# Patient Record
Sex: Male | Born: 2000 | Race: White | Hispanic: No | Smoking: Former smoker
Health system: Southern US, Community
[De-identification: ages and names within clinical notes are randomized; demographics above are authoritative.]

## PROBLEM LIST (undated history)

## (undated) DIAGNOSIS — I471 Supraventricular tachycardia, unspecified: Secondary | ICD-10-CM

## (undated) DIAGNOSIS — F909 Attention-deficit hyperactivity disorder, unspecified type: Secondary | ICD-10-CM

## (undated) DIAGNOSIS — I499 Cardiac arrhythmia, unspecified: Secondary | ICD-10-CM

## (undated) DIAGNOSIS — R011 Cardiac murmur, unspecified: Secondary | ICD-10-CM

## (undated) DIAGNOSIS — F319 Bipolar disorder, unspecified: Secondary | ICD-10-CM

## (undated) HISTORY — DX: Cardiac murmur, unspecified: R01.1

## (undated) HISTORY — PX: APPENDECTOMY: SHX54

---
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
## 2019-08-05 ENCOUNTER — Emergency Department (HOSPITAL_COMMUNITY)
Admission: EM | Admit: 2019-08-05 | Discharge: 2019-08-05 | Disposition: A | Discharge status: Home / Self Care | Payer: Self-pay | Attending: Emergency Medicine | Admitting: Emergency Medicine

## 2019-08-05 ENCOUNTER — Other Ambulatory Visit: Payer: Self-pay

## 2019-08-05 ENCOUNTER — Emergency Department (HOSPITAL_COMMUNITY): Payer: Self-pay

## 2019-08-05 ENCOUNTER — Encounter (HOSPITAL_COMMUNITY): Payer: Self-pay | Admitting: Emergency Medicine

## 2019-08-05 DIAGNOSIS — R079 Chest pain, unspecified: Secondary | ICD-10-CM

## 2019-08-05 DIAGNOSIS — R072 Precordial pain: Secondary | ICD-10-CM | POA: Insufficient documentation

## 2019-08-05 DIAGNOSIS — R55 Syncope and collapse: Secondary | ICD-10-CM | POA: Insufficient documentation

## 2019-08-05 DIAGNOSIS — F17210 Nicotine dependence, cigarettes, uncomplicated: Secondary | ICD-10-CM | POA: Insufficient documentation

## 2019-08-05 LAB — CBC
HCT: 45 % (ref 39.0–52.0)
Hemoglobin: 15 g/dL (ref 13.0–17.0)
MCH: 29 pg (ref 26.0–34.0)
MCHC: 33.3 g/dL (ref 30.0–36.0)
MCV: 87 fL (ref 80.0–100.0)
Platelets: 295 10*3/uL (ref 150–400)
RBC: 5.17 MIL/uL (ref 4.22–5.81)
RDW: 12.9 % (ref 11.5–15.5)
WBC: 11.7 10*3/uL — ABNORMAL HIGH (ref 4.0–10.5)
nRBC: 0 % (ref 0.0–0.2)

## 2019-08-05 LAB — BASIC METABOLIC PANEL
Anion gap: 12 (ref 5–15)
BUN: 19 mg/dL (ref 6–20)
CO2: 21 mmol/L — ABNORMAL LOW (ref 22–32)
Calcium: 9.3 mg/dL (ref 8.9–10.3)
Chloride: 105 mmol/L (ref 98–111)
Creatinine, Ser: 0.84 mg/dL (ref 0.61–1.24)
GFR calc Af Amer: >60 mL/min (ref >60)
GFR calc non Af Amer: >60 mL/min (ref >60)
Glucose, Bld: 92 mg/dL (ref 70–99)
Potassium: 3.6 mmol/L (ref 3.5–5.1)
Sodium: 138 mmol/L (ref 135–145)

## 2019-08-05 LAB — TROPONIN I (HIGH SENSITIVITY)
Troponin I (High Sensitivity): <2 ng/L (ref <18)
Troponin I (High Sensitivity): 2 ng/L (ref <18)

## 2019-08-05 NOTE — ED Notes (Signed)
Pt ambulatory to waiting room. Pt verbalized understanding of discharge instructions.   

## 2019-08-05 NOTE — Discharge Instructions (Signed)
Follow-up with the cardiology clinic in the next few days.  Their contact information has been provided in this discharge summary for you to call and make these arrangements.  Return to the emergency department in the meantime if your symptoms worsen or change.  No strenuous activity until seen by cardiology.

## 2019-08-05 NOTE — ED Provider Notes (Signed)
[facility] EMERGENCY DEPARTMENT Provider Note   CSN: [id number] Arrival date & time: 08/05/19  1858     History Chief Complaint  Patient presents with  . Chest Pain    Gene Berry is a 19 y.o. male.  Patient is a 19 year old male with history of ADHD, bipolar disorder, appendectomy.  He presents today for evaluation of chest pain and syncope.  Patient works Games developer and a Presenter, broadcasting.  He works in a [employer] with significant heat.  Today as he was working he developed an episode of tightness in his chest, left arm numbness, followed by a brief period of unconsciousness.  He was apparently awakened by his coworkers, then taken to urgent care.  He was then sent here for further evaluation.  His discomfort has since resolved and he now feels fine.  Patient does report similar episodes over the past several months, however has never passed out before.  Patient has no cardiac risk factors except obesity.  The history is provided by the patient.  Chest Pain Pain location:  Substernal area Pain quality: tightness   Pain radiates to:  L arm Pain severity:  Moderate Onset quality:  Sudden Timing:  Constant Progression:  Resolved Relieved by:  Nothing Worsened by:  Nothing Ineffective treatments:  None tried Associated symptoms: syncope   Associated symptoms: no nausea and no shortness of breath        Past Medical History:  Diagnosis Date  . ADHD   . Bipolar affective disorder (HCC)     There are no problems to display for this patient.   Past Surgical History:  Procedure Laterality Date  . APPENDECTOMY         History reviewed. No pertinent family history.  Social History   Tobacco Use  . Smoking status: Current Every Day Smoker    Packs/day: 2.00    Types: Cigarettes  . Smokeless tobacco: Current User    Types: Chew  Vaping Use  . Vaping Use: Some days  Substance Use Topics  . Alcohol use: Not Currently  . Drug use: Not Currently      Home Medications Prior to Admission medications   Not on File    Allergies    Augmentin [amoxicillin-pot clavulanate]  Review of Systems   Review of Systems  Respiratory: Negative for shortness of breath.   Cardiovascular: Positive for chest pain and syncope.  Gastrointestinal: Negative for nausea.  All other systems reviewed and are negative.   Physical Exam Updated Vital Signs BP 136/85 (BP Location: Right Arm)   Pulse 95   Temp 98.3 F (36.8 C) (Oral)   Resp 20   Ht 5\' 9"  (1.753 m)   Wt (!) 140.2 kg   SpO2 98%   BMI 45.63 kg/m   Physical Exam Vitals and nursing note reviewed.  Constitutional:      General: He is not in acute distress.    Appearance: He is well-developed. He is not diaphoretic.  HENT:     Head: Normocephalic and atraumatic.  Cardiovascular:     Rate and Rhythm: Normal rate and regular rhythm.     Heart sounds: No murmur heard.  No friction rub.  Pulmonary:     Effort: Pulmonary effort is normal. No respiratory distress.     Breath sounds: Normal breath sounds. No wheezing or rales.  Abdominal:     General: Bowel sounds are normal. There is no distension.     Palpations: Abdomen is soft.  Tenderness: There is no abdominal tenderness.  Musculoskeletal:        General: Normal range of motion.     Cervical back: Normal range of motion and neck supple.     Right lower leg: No tenderness. No edema.     Left lower leg: No tenderness. No edema.  Skin:    General: Skin is warm and dry.  Neurological:     Mental Status: He is alert and oriented to person, place, and time.     Coordination: Coordination normal.     ED Results / Procedures / Treatments   Labs (all labs ordered are listed, but only abnormal results are displayed) Labs Reviewed  BASIC METABOLIC PANEL - Abnormal; Notable for the following components:      Result Value   CO2 21 (*)    All other components within normal limits  CBC - Abnormal; Notable for the following  components:   WBC 11.7 (*)    All other components within normal limits  TROPONIN I (HIGH SENSITIVITY)  TROPONIN I (HIGH SENSITIVITY)    EKG ED ECG REPORT   Date: 08/05/2019  Rate: 100  Rhythm: sinus tachycardia  QRS Axis: normal  Intervals: normal  ST/T Wave abnormalities: normal  Conduction Disutrbances:none  Narrative Interpretation:   Old EKG Reviewed: none available  I have personally reviewed the EKG tracing and agree with the computerized printout as noted.   Radiology DG Chest 2 View  Result Date: 08/05/2019 CLINICAL DATA:  19 year old male with chest pain. EXAM: CHEST - 2 VIEW COMPARISON:  None. FINDINGS: The heart size and mediastinal contours are within normal limits. Both lungs are clear. The visualized skeletal structures are unremarkable. IMPRESSION: No active cardiopulmonary disease. Electronically Signed   By: Elgie Collard M.D.   On: 08/05/2019 19:30    Procedures Procedures (including critical care time)  Medications Ordered in ED Medications - No data to display  ED Course  I have reviewed the triage vital signs and the nursing notes.  Pertinent labs & imaging results that were available during my care of the patient were reviewed by me and considered in my medical decision making (see chart for details).    MDM Rules/Calculators/A&P  Patient presenting here with complaints of chest pain and syncope as described in the HPI.  Patient's EKG shows a sinus tachycardia with no obvious abnormality.  Troponin x2 is negative.  I am uncertain as to the etiology of this patient's symptoms, however I feel as though an echocardiogram and cardiology follow-up would be appropriate.  Patient will be given the number for the cardiology clinic to call and make this appointment.  He is to return in the meantime if his symptoms worsen or change.  Final Clinical Impression(s) / ED Diagnoses Final diagnoses:  None    Rx / DC Orders ED Discharge Orders    None         Geoffery Lyons, MD 08/05/19 2213

## 2019-08-05 NOTE — ED Notes (Signed)
Not available for triage

## 2019-08-05 NOTE — ED Triage Notes (Signed)
Patient is having chest pain that started at work around 1230 pm, had a syncopal episode, was sent to urgent care, arrive here around 130 pm, but fell asleep in the waiting room.

## 2019-09-19 ENCOUNTER — Ambulatory Visit: Payer: Self-pay | Admitting: Internal Medicine

## 2019-10-03 ENCOUNTER — Other Ambulatory Visit: Payer: Self-pay

## 2019-10-03 ENCOUNTER — Ambulatory Visit (INDEPENDENT_AMBULATORY_CARE_PROVIDER_SITE_OTHER): Payer: Self-pay | Admitting: Cardiology

## 2019-10-03 ENCOUNTER — Encounter: Payer: Self-pay | Admitting: *Deleted

## 2019-10-03 ENCOUNTER — Encounter: Payer: Self-pay | Admitting: Cardiology

## 2019-10-03 VITALS — BP 120/78 | HR 102 | Ht 70.0 in | Wt 305.0 lb

## 2019-10-03 DIAGNOSIS — R002 Palpitations: Secondary | ICD-10-CM

## 2019-10-03 DIAGNOSIS — R079 Chest pain, unspecified: Secondary | ICD-10-CM

## 2019-10-03 NOTE — Progress Notes (Signed)
Clinical Summary Gene Berry is a 19 y.o.male seen today as a new patient for the following medical problems.   1. 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 Left sided pain, sharp stabbing 10/10. Vomited, felt weak. Stood up and troubles standing due weakness. Sat back down - recurrent pain down left arm, left arm went numb and passed out - was told at urgent care HRs were elevated.  - constant pain x 4 hours, worst with movement.  - taken to ER  - similar pain few weeks prior.  - intermittent left arm numbness since Wed. Can get heacache.  - can have sudden episodes of feeling like he may pass out. +palpitations - limited caffeine      Past Medical History:  Diagnosis Date  . ADHD   . Bipolar affective disorder (HCC)      Allergies  Allergen Reactions  . Augmentin [Amoxicillin-Pot Clavulanate] Anaphylaxis     No current outpatient medications on file.   No current facility-administered medications for this visit.     Past Surgical History:  Procedure Laterality Date  . APPENDECTOMY       Allergies  Allergen Reactions  . Augmentin [Amoxicillin-Pot Clavulanate] Anaphylaxis      No family history on file.   Social History Gene Berry reports that he has been smoking cigarettes. He has been smoking about 2.00 packs per day. His smokeless tobacco use includes chew. Gene Berry reports previous alcohol use.   Review of Systems CONSTITUTIONAL: No weight loss, fever, chills, weakness or fatigue.  HEENT: Eyes: No visual loss, blurred vision, double vision or yellow sclerae.No hearing loss, sneezing, congestion, runny nose or sore throat.  SKIN: No rash or itching.  CARDIOVASCULAR: per hpi RESPIRATORY: No shortness of breath, cough or sputum.    GASTROINTESTINAL: No anorexia, nausea, vomiting or diarrhea. No abdominal pain or blood.  GENITOURINARY: No burning on urination, no polyuria NEUROLOGICAL: No headache, dizziness, syncope, paralysis, ataxia, numbness or tingling in the extremities. No change in bowel or bladder control.  MUSCULOSKELETAL: No muscle, back pain, joint pain or stiffness.  LYMPHATICS: No enlarged nodes. No history of splenectomy.  PSYCHIATRIC: No history of depression or anxiety.  ENDOCRINOLOGIC: No reports of sweating, cold or heat intolerance. No polyuria or polydipsia.  Marland Kitchen   Physical Examination Today's Vitals   10/03/19 0850 10/03/19 0856  BP: 130/86 120/78  Pulse: (!) 102   SpO2: 98%   Weight: (!) 305 lb (138.3 kg)   Height: 5\' 10"  (1.778 m)    Body mass index is 43.76 kg/m.  Gen: resting comfortably, no acute distress HEENT: no scleral icterus, pupils equal round and reactive, no palptable cervical adenopathy,  CV: RRR, no m/r/g no jvd Resp: Clear to auscultation bilaterally GI: abdomen is soft, non-tender, non-distended, normal bowel sounds, no hepatosplenomegaly MSK: extremities are warm, no edema.  Skin: warm, no rash Neuro:  no focal deficits Psych: appropriate affect     Assessment and Plan  1. Chest pain/Palpitations/Syncope/Left arm weakness - unclear etiology of this constellation of symptoms in this very young patient - will obtain echo to evaluate for any underlying cardiac dysfunction - obtain heart monitor to evaluate for significant arrhythmias - if benign cardiac workup may need  neuro eval with arm weakness, syncope - no plans for ischemic testing at this time in this 19 year old patient      Antoine Poche, M.D.

## 2019-10-03 NOTE — Patient Instructions (Signed)
Your physician recommends that you schedule a follow-up appointment in: 6 WEEKS WITH Nena Polio, NP  Your physician recommends that you continue on your current medications as directed. Please refer to the Current Medication list given to you today.  Your physician has requested that you have an echocardiogram. Echocardiography is a painless test that uses sound waves to create images of your heart. It provides your doctor with information about the size and shape of your heart and how well your heart's chambers and valves are working. This procedure takes approximately one hour. There are no restrictions for this procedure.  ZIO PATCH 14 DAYS   Thank you for choosing [facility]!!

## 2019-10-08 ENCOUNTER — Telehealth: Payer: Self-pay | Admitting: Cardiology

## 2019-10-08 ENCOUNTER — Other Ambulatory Visit: Payer: Self-pay

## 2019-10-08 ENCOUNTER — Ambulatory Visit (HOSPITAL_COMMUNITY)
Admission: RE | Admit: 2019-10-08 | Discharge: 2019-10-08 | Disposition: A | Discharge status: Home / Self Care | Payer: Self-pay | Source: Ambulatory Visit | Attending: Cardiology | Admitting: Cardiology

## 2019-10-08 DIAGNOSIS — R079 Chest pain, unspecified: Secondary | ICD-10-CM

## 2019-10-08 LAB — ECHOCARDIOGRAM COMPLETE
Area-P 1/2: 2.81 cm2
S' Lateral: 3.42 cm

## 2019-10-08 NOTE — Telephone Encounter (Signed)
Patient called stating that he has not received his heart monitor. Requested to know test results. States he is suppose to return to work on 10-11-2019. If unable to return to work will need a note to send to [facility] before Friday afternoon.

## 2019-10-08 NOTE — Progress Notes (Signed)
*  PRELIMINARY RESULTS* Echocardiogram 2D Echocardiogram has been performed.  Stacey Drain 10/08/2019, 2:29 PM

## 2019-10-09 ENCOUNTER — Encounter: Payer: Self-pay | Admitting: *Deleted

## 2019-10-09 NOTE — Telephone Encounter (Signed)
Just resulted, sent it to [facility] here by mistake, will forward to you  Dominga Ferry MD

## 2019-10-09 NOTE — Telephone Encounter (Signed)
As per recent conversation with admin (at last appt) pt was supposed to bring insurance info before we enrolled him  - clarified that pt doesn't have insurance - spoke with [employer] who says out of pocket would be $395 also pt has another option of qualifying for assistance based on household income (pt has to call company and apply over the phone) spoke with pt and agreeable to monitor and will call about assistance - requesting Dr Wyline Mood review his echo and possible extend his work note for HR staying in the 120s while resting yesterday

## 2019-10-09 NOTE — Telephone Encounter (Signed)
Pt aware and will come by office to pick up work note     Echo looks good, normal heart function. Ok to extend his work absence 2 weeks as we sort out him getting the heart monitor   J BrancH MD

## 2019-10-09 NOTE — Telephone Encounter (Signed)
Patient called checking to see if test results have been reviewed. Also, needs to know if he will be able to return to work.

## 2019-10-28 ENCOUNTER — Telehealth: Payer: Self-pay | Admitting: Cardiology

## 2019-10-28 ENCOUNTER — Other Ambulatory Visit: Payer: Self-pay | Admitting: *Deleted

## 2019-10-28 ENCOUNTER — Ambulatory Visit (INDEPENDENT_AMBULATORY_CARE_PROVIDER_SITE_OTHER): Payer: Self-pay

## 2019-10-28 DIAGNOSIS — R002 Palpitations: Secondary | ICD-10-CM

## 2019-10-28 NOTE — Telephone Encounter (Signed)
zio monitor scanned into pt chart

## 2019-10-28 NOTE — Telephone Encounter (Signed)
Patient called requesting Monitor results

## 2019-10-29 NOTE — Telephone Encounter (Signed)
Heart monitor looks fine, no significant arrhythmias. Significant heart disease in a 19 year old is rare and based on his echo findings and monitor all testing has benign. Needs to f/u with pcp to consider alternative causes of his symptoms   Dominga Ferry MD

## 2019-10-29 NOTE — Telephone Encounter (Signed)
Pt voiced understanding

## 2019-11-12 NOTE — Progress Notes (Signed)
Cardiology Office Note  Date: 11/13/2019   ID: Gene Berry, DOB 2000/12/11, MRN [id number]  PCP:  Patient, No Pcp Per  Cardiologist:  No primary care provider on file. Electrophysiologist:  None   Chief Complaint: Chest pain of uncertain etiology  History of Present Illness: Gene Berry is a 19 y.o. male with a history of chest pain/syncope, ADHD, bipolar disease.  Last encounter with Dr. Wyline Mood 10/03/2019 status post ER visit with chest pain and syncope on 07/2019.  Troponins were negative x3, EKG showed no ischemic changes.  Chest x-ray : no acute process.  Had been at work unloading boxes in hot temperatures.  Had left-sided chest pain sharp stabbing in nature.  Vomited, felt weak. Trouble standing. Pain in left arm. Went numb and passed out. Could have sudden episodes of feeling like he may pass out. Palpitations. 14 day Zio patch was ordered. Echo ordered. No plans for ischemic work up.  Echocardiogram: EF 55 to 60%,  Trivial MR.  Cardiac monitor: Rare supraventricular ectopy in the form of isolated PACs, couplets.  Rare ventricular ectopy in form of isolated PVCs.  Symptoms correlated with sinus rhythm and mild sinus tachycardia.  Here for follow-up today.  He continues to complain of dizziness and rapid heart rate.  States dizziness associated with changes in position such as going from lying to sitting to standing, or bending then attempting to stand erect with dizziness with associated increases in heart rate.  He denies any significant caffeine intake, stimulant intake, recreational drugs.  States he has chest pain about 15 to 20 minutes after he eats.  He points to left precordial area and states it radiates around to his left axillary area.  He denies any associated radiation to neck, arm, back, jaw.  Denies any nausea, vomiting, or diaphoresis associated.  Heart rate today on arrival is 116.  He states he has a strong family history of diabetes in his father.  He admits to  frequent urination and drinking frequent liquids.  Past Medical History:  Diagnosis Date  . ADHD   . Bipolar affective disorder [facility])     Past Surgical History:  Procedure Laterality Date  . APPENDECTOMY      Current Outpatient Medications  Medication Sig Dispense Refill  . metoprolol tartrate (LOPRESSOR) 25 MG tablet Take 0.5 tablets (12.5 mg total) by mouth 2 (two) times daily. 15 tablet 6   No current facility-administered medications for this visit.   Allergies:  Augmentin [amoxicillin-pot clavulanate]   Social History: The patient  reports that he has been smoking cigarettes. He has been smoking about 2.00 packs per day. His smokeless tobacco use includes chew. He reports previous alcohol use. He reports previous drug use.   Family History: The patient's family history includes Anxiety disorder in his mother; Cancer - Other in his father; Cirrhosis in his maternal grandfather; Heart attack in his maternal grandfather, maternal grandmother, paternal grandfather, and paternal grandmother; Hypertension in his father; Stroke in his maternal grandfather.   ROS:  Please see the history of present illness. Otherwise, complete review of systems is positive for none.  All other systems are reviewed and negative.   Physical Exam: VS:  BP 112/80   Pulse (!) 116   Ht 5\' 10"  (1.778 m)   Wt (!) 309 lb 6.4 oz (140.3 kg)   SpO2 99%   BMI 44.39 kg/m , BMI Body mass index is 44.39 kg/m.  Wt Readings from Last 3 Encounters:  11/13/19 (!) 309 lb  6.4 oz (140.3 kg) (>99 %, Z= 3.17)*  10/03/19 (!) 305 lb (138.3 kg) (>99 %, Z= 3.12)*  08/05/19 (!) 309 lb (140.2 kg) (>99 %, Z= 3.17)*   * Growth percentiles are based on CDC (Boys, 2-20 Years) data.    General: Patient appears comfortable at rest. HEENT: Conjunctiva and lids normal, oropharynx clear with moist mucosa. Neck: Supple, no elevated JVP or carotid bruits, no thyromegaly. Lungs: Clear to auscultation, nonlabored breathing at  rest. Cardiac: Tachycardic rate and rhythm, no S3 or significant systolic murmur, no pericardial rub. Abdomen: Soft, nontender, no hepatomegaly, bowel sounds present, no guarding or rebound. Extremities: No pitting edema, distal pulses 2+. Skin: Warm and dry. Musculoskeletal: No kyphosis. Neuropsychiatric: Alert and oriented x3, affect grossly appropriate.  ECG:    Recent Labwork: 08/05/2019: BUN 19; Creatinine, Ser 0.84; Hemoglobin 15.0; Platelets 295; Potassium 3.6; Sodium 138  No results found for: CHOL, TRIG, HDL, CHOLHDL, VLDL, LDLCALC, LDLDIRECT  Other Studies Reviewed Today:  Cardiac monitor 10/28/2019 Study Highlights   9 days 14 hours of monitored data  Min HR 43, Max HR 177, Avg HR 91  Rare supraventricular ectopy in the form of isolated PACs, couplets  Rare ventricular ectopy in the form of isolated PVCs  Symptoms correlate with sinus rhythm and mild sinus tachycardia   Echocardiogram 10/08/2019 1. Left ventricular ejection fraction, by estimation, is 55 to 60%. The left ventricle has normal function. Left ventricular endocardial border not optimally defined to evaluate regional wall motion. Left ventricular diastolic parameters were normal. 2. Right ventricular systolic function is normal. The right ventricular size is normal. Tricuspid regurgitation signal is inadequate for assessing PA pressure. 3. The mitral valve is grossly normal. Trivial mitral valve regurgitation. 4. The aortic valve is tricuspid. Aortic valve regurgitation is not visualized. 5. The inferior vena cava is normal in size with greater than 50% respiratory variability, suggesting right atrial pressure of 3 mmHg.  Assessment and Plan:  1. Chest pain of uncertain etiology   2. Syncope, unspecified syncope type   3. Palpitations    1. Chest pain of uncertain etiology Continues to complain of chest pain mostly after eating approximately 15 to 20 minutes after eating he experiences left  precordial pain which radiates along the bottom of the rib cage over the axillary area.  Denies any radiation to neck, arm, back, jaw.  Denies any associated nausea, vomiting, or diaphoresis.  Denies any history of GERD or reflux symptoms.  2. Syncope, unspecified syncope type Continues to state he feels dizzy quite a bit usually going from a lying to sitting or sitting to standing position.  Sometimes when bending over and attempting to stand erect.  States recent episodes in the shower feeling as though he may pass out.  We will get orthostatic vital signs today.  Question orthostatic hypotension, autonomic dysfunction, diabetes mellitus, POTS syndrome. Get CBC, BMP, TSH, hemoglobin A1c.  Has strong family history of diabetes in first-degree relative in his father.  3. Palpitations Patient appears to have consistent tachycardia.  Today on arrival heart rate is 116.  He states his heart rate states elevated generally speaking but becomes worse when attempting to change positions from lying to sitting to standing. Start metoprolol 12.5 mg p.o. twice daily.    Medication Adjustments/Labs and Tests Ordered: Current medicines are reviewed at length with the patient today.  Concerns regarding medicines are outlined above.   Disposition: Follow-up with Dr. Wyline Mood or APP 1 month  Signed, Rennis Harding, NP 11/13/2019 2:41  PM    [facility] at [place], [place], [place] [address] Phone: [phone]; Fax: [phone]

## 2019-11-13 ENCOUNTER — Ambulatory Visit (INDEPENDENT_AMBULATORY_CARE_PROVIDER_SITE_OTHER): Payer: Self-pay | Admitting: Family Medicine

## 2019-11-13 ENCOUNTER — Encounter: Payer: Self-pay | Admitting: Family Medicine

## 2019-11-13 ENCOUNTER — Other Ambulatory Visit: Payer: Self-pay

## 2019-11-13 VITALS — BP 112/80 | HR 116 | Ht 70.0 in | Wt 309.4 lb

## 2019-11-13 DIAGNOSIS — R079 Chest pain, unspecified: Secondary | ICD-10-CM

## 2019-11-13 DIAGNOSIS — R002 Palpitations: Secondary | ICD-10-CM

## 2019-11-13 DIAGNOSIS — R55 Syncope and collapse: Secondary | ICD-10-CM

## 2019-11-13 MED ORDER — METOPROLOL TARTRATE 25 MG PO TABS: 12.5000 mg | ORAL_TABLET | Freq: Two times a day (BID) | ORAL | 6 refills | Status: DC

## 2019-11-13 NOTE — Patient Instructions (Addendum)
Medication Instructions:   Begin Lopressor 12.5mg  twice a day  Continue all other current medications.  Labwork:  CBC, BMET, TSH, HgA1C - orders given today.   Reminder:  Nothing to eat or drink after 12 midnight prior to labs.  Office will contact with results via phone or letter.    Testing/Procedures: none  Follow-Up: 1 month   Any Other Special Instructions Will Be Listed Below (If Applicable).  If you need a refill on your cardiac medications before your next appointment, please call your pharmacy.

## 2019-11-14 ENCOUNTER — Other Ambulatory Visit (HOSPITAL_COMMUNITY)
Admission: RE | Admit: 2019-11-14 | Discharge: 2019-11-14 | Disposition: A | Discharge status: Home / Self Care | Payer: Self-pay | Source: Ambulatory Visit | Attending: Family Medicine | Admitting: Family Medicine

## 2019-11-14 DIAGNOSIS — R079 Chest pain, unspecified: Secondary | ICD-10-CM | POA: Insufficient documentation

## 2019-11-14 DIAGNOSIS — R55 Syncope and collapse: Secondary | ICD-10-CM | POA: Insufficient documentation

## 2019-11-14 DIAGNOSIS — R002 Palpitations: Secondary | ICD-10-CM | POA: Insufficient documentation

## 2019-11-14 LAB — BASIC METABOLIC PANEL
Anion gap: 8 (ref 5–15)
BUN: 19 mg/dL (ref 6–20)
CO2: 24 mmol/L (ref 22–32)
Calcium: 9.6 mg/dL (ref 8.9–10.3)
Chloride: 105 mmol/L (ref 98–111)
Creatinine, Ser: 0.82 mg/dL (ref 0.61–1.24)
GFR, Estimated: >60 mL/min (ref >60)
Glucose, Bld: 103 mg/dL — ABNORMAL HIGH (ref 70–99)
Potassium: 3.9 mmol/L (ref 3.5–5.1)
Sodium: 137 mmol/L (ref 135–145)

## 2019-11-14 LAB — CBC
HCT: 48.4 % (ref 39.0–52.0)
Hemoglobin: 16.1 g/dL (ref 13.0–17.0)
MCH: 29 pg (ref 26.0–34.0)
MCHC: 33.3 g/dL (ref 30.0–36.0)
MCV: 87.1 fL (ref 80.0–100.0)
Platelets: 319 10*3/uL (ref 150–400)
RBC: 5.56 MIL/uL (ref 4.22–5.81)
RDW: 12.6 % (ref 11.5–15.5)
WBC: 8.5 10*3/uL (ref 4.0–10.5)
nRBC: 0 % (ref 0.0–0.2)

## 2019-11-14 LAB — TSH: TSH: 1.381 u[IU]/mL (ref 0.350–4.500)

## 2019-11-17 ENCOUNTER — Telehealth: Payer: Self-pay | Admitting: *Deleted

## 2019-11-17 LAB — HEMOGLOBIN A1C
Hgb A1c MFr Bld: 5.5 % (ref 4.8–5.6)
Mean Plasma Glucose: 111 mg/dL

## 2019-11-17 NOTE — Telephone Encounter (Signed)
Lesle Chris, LPN  04/06/9456 11:46 AM EDT Back to Top    Voice mailbox not set up.    Netta Neat., NP  11/17/2019 11:42 AM EDT     Hemoglobin A1c looks good at 5.5%.    Netta Neat., NP  11/14/2019 4:49 PM EDT     Please call the patient and tell him all of his lab work looks good except his fasting glucose was slightly above normal. We still do not have his hemoglobin A1c yet. Tell him we will call him if this is abnormal when we get the results.

## 2019-11-20 ENCOUNTER — Encounter: Payer: Self-pay | Admitting: *Deleted

## 2019-11-20 NOTE — Telephone Encounter (Signed)
Lesle Chris, LPN  16/38/4536 1:35 PM EDT Back to Top    2nd attempt - voice mail box not set up. Will send results via my chart.

## 2019-12-10 NOTE — Progress Notes (Signed)
Cardiology Office Note  Date: 12/11/2019   ID: Gene Berry, DOB [birth year], MRN [id number]  PCP:  Patient, No Pcp Per  Cardiologist:  Dina Rich, MD Electrophysiologist:  None   Chief Complaint: Chest pain of uncertain etiology  History of Present Illness: Gene Berry is a 19 y.o. male with a history of chest pain/syncope, ADHD, bipolar disease.  Last encounter with Dr. Wyline Mood 10/03/2019 status post ER visit with chest pain and syncope on 07/2019.  Troponins were negative x3, EKG showed no ischemic changes.  Chest x-ray : no acute process.  Had been at work unloading boxes in hot temperatures.  Had left-sided chest pain sharp stabbing in nature.  Vomited, felt weak. Trouble standing. Pain in left arm. Went numb and passed out. Could have sudden episodes of feeling like he may pass out. Palpitations. 14 day Zio patch was ordered. Echo ordered. No plans for ischemic work up.  Echocardiogram: EF 55 to 60%,  Trivial MR.  Cardiac monitor: Rare supraventricular ectopy in the form of isolated PACs, couplets.  Rare ventricular ectopy in form of isolated PVCs.  Symptoms correlated with sinus rhythm and mild sinus tachycardia.  Last visit on 11/13/2019.  He continued to complain of dizziness and rapid heart rate.  Stated dizziness associated with changes in position such as going from lying to sitting to standing, or bending then attempting to stand erect with dizziness with associated increases in heart rate.  He denied any significant caffeine intake, stimulant intake, recreational drugs.  Stated he had chest pain about 15 to 20 minutes after he eats.  Pointed to left precordial area and s stated it radiated around to his left axillary area.  He denied any associated radiation to neck, arm, back, jaw.  No nausea, vomiting, or diaphoresis associated.  Heart rate on arrival was 116.  He stated he had a strong family history of diabetes in his father.  He admitted to frequent urination and  drinking frequent liquids.   He presents today for 1 month follow-up.  He states the medication seems to be helping him with his heart rate and dizziness.   Past Medical History:  Diagnosis Date  . ADHD   . Bipolar affective disorder [facility])     Past Surgical History:  Procedure Laterality Date  . APPENDECTOMY      Current Outpatient Medications  Medication Sig Dispense Refill  . metoprolol tartrate (LOPRESSOR) 25 MG tablet Take 0.5 tablets (12.5 mg total) by mouth 2 (two) times daily. 15 tablet 6   No current facility-administered medications for this visit.   Allergies:  Augmentin [amoxicillin-pot clavulanate]   Social History: The patient  reports that he has been smoking cigarettes. He has been smoking about 2.00 packs per day. His smokeless tobacco use includes chew. He reports previous alcohol use. He reports previous drug use.   Family History: The patient's family history includes Anxiety disorder in his mother; Cancer - Other in his father; Cirrhosis in his maternal grandfather; Heart attack in his maternal grandfather, maternal grandmother, paternal grandfather, and paternal grandmother; Hypertension in his father; Stroke in his maternal grandfather.   ROS:  Please see the history of present illness. Otherwise, complete review of systems is positive for none.  All other systems are reviewed and negative.   Physical Exam: VS:  BP 132/64   Pulse 96   Ht 5\' 9"  (1.753 m)   Wt (!) 329 lb (149.2 kg)   SpO2 96%   BMI 48.58 kg/m , BMI  Body mass index is 48.58 kg/m.  Wt Readings from Last 3 Encounters:  12/11/19 (!) 329 lb (149.2 kg) (>99 %, Z= 3.36)*  11/13/19 (!) 309 lb 6.4 oz (140.3 kg) (>99 %, Z= 3.17)*  10/03/19 (!) 305 lb (138.3 kg) (>99 %, Z= 3.12)*   * Growth percentiles are based on CDC (Boys, 2-20 Years) data.    General: Patient appears comfortable at rest. Neck: Supple, no elevated JVP or carotid bruits, no thyromegaly. Lungs: Clear to auscultation,  nonlabored breathing at rest. Cardiac: Tachycardic rate and rhythm, no S3 or significant systolic murmur, no pericardial rub. Extremities: No pitting edema, distal pulses 2+. Skin: Warm and dry. Musculoskeletal: No kyphosis. Neuropsychiatric: Alert and oriented x3, affect grossly appropriate.  ECG:    Recent Labwork: 11/14/2019: BUN 19; Creatinine, Ser 0.82; Hemoglobin 16.1; Platelets 319; Potassium 3.9; Sodium 137; TSH 1.381  No results found for: CHOL, TRIG, HDL, CHOLHDL, VLDL, LDLCALC, LDLDIRECT  Other Studies Reviewed Today:  Cardiac monitor 10/28/2019 Study Highlights   9 days 14 hours of monitored data  Min HR 43, Max HR 177, Avg HR 91  Rare supraventricular ectopy in the form of isolated PACs, couplets  Rare ventricular ectopy in the form of isolated PVCs  Symptoms correlate with sinus rhythm and mild sinus tachycardia   Echocardiogram 10/08/2019 1. Left ventricular ejection fraction, by estimation, is 55 to 60%. The left ventricle has normal function. Left ventricular endocardial border not optimally defined to evaluate regional wall motion. Left ventricular diastolic parameters were normal. 2. Right ventricular systolic function is normal. The right ventricular size is normal. Tricuspid regurgitation signal is inadequate for assessing PA pressure. 3. The mitral valve is grossly normal. Trivial mitral valve regurgitation. 4. The aortic valve is tricuspid. Aortic valve regurgitation is not visualized. 5. The inferior vena cava is normal in size with greater than 50% respiratory variability, suggesting right atrial pressure of 3 mmHg.  Assessment and Plan:   1. Chest pain of uncertain etiology Currently denies any anginal symptoms.  2. Syncope, unspecified syncope type No recent symptoms of lightheadedness dizziness, presyncope or syncope.  3. Palpitations Recent cardiac monitor showed rare supraventricular ectopy and form of isolated PACs/couplets rare  ventricular ectopy with isolated PVCs.  Symptoms correlated with sinus rhythm and mild sinus tachycardia.  Continue metoprolol 12.5 mg p.o. twice daily.  Heart rate has decreased since last visit from 116 to rate of 96 today.  He denies any sensation of palpitations, arrhythmias, racing heart.  4.  Suspected sleep apnea  Patient states his fiance tells him he snores and occasionally wakes up gasping for breath.  Admits to some occasional daytime sleepiness.  Please refer to for sleep study to assess for OSA.   Medication Adjustments/Labs and Tests Ordered: Current medicines are reviewed at length with the patient today.  Concerns regarding medicines are outlined above.   Disposition: Follow-up with Dr. Wyline Mood or APP 6 months  Signed, Rennis Harding, NP 12/11/2019 2:48 PM    [facility] at [facility] [address] [place], [place], [place] [address] Phone: [phone]; Fax: [phone]

## 2019-12-11 ENCOUNTER — Ambulatory Visit (INDEPENDENT_AMBULATORY_CARE_PROVIDER_SITE_OTHER): Payer: Self-pay | Admitting: Family Medicine

## 2019-12-11 ENCOUNTER — Encounter: Payer: Self-pay | Admitting: Family Medicine

## 2019-12-11 VITALS — BP 132/64 | HR 96 | Ht 69.0 in | Wt 329.0 lb

## 2019-12-11 DIAGNOSIS — R55 Syncope and collapse: Secondary | ICD-10-CM

## 2019-12-11 DIAGNOSIS — R29818 Other symptoms and signs involving the nervous system: Secondary | ICD-10-CM

## 2019-12-11 DIAGNOSIS — R002 Palpitations: Secondary | ICD-10-CM

## 2019-12-11 DIAGNOSIS — R079 Chest pain, unspecified: Secondary | ICD-10-CM

## 2019-12-11 NOTE — Patient Instructions (Addendum)
Medication Instructions:   Your physician recommends that you continue on your current medications as directed. Please refer to the Current Medication list given to you today.  Labwork:  None  Testing/Procedures:  None  Follow-Up:  Your physician recommends that you schedule a follow-up appointment in: 6 months  Any Other Special Instructions Will Be Listed Below (If Applicable).  You have been referred to [facility]  If you need a refill on your cardiac medications before your next appointment, please call your pharmacy. 

## 2020-01-29 ENCOUNTER — Ambulatory Visit (INDEPENDENT_AMBULATORY_CARE_PROVIDER_SITE_OTHER): Payer: Self-pay | Admitting: Pulmonary Disease

## 2020-01-29 ENCOUNTER — Other Ambulatory Visit: Payer: Self-pay

## 2020-01-29 ENCOUNTER — Encounter: Payer: Self-pay | Admitting: Pulmonary Disease

## 2020-01-29 VITALS — BP 134/92 | HR 86 | Temp 97.4°F | Ht 67.0 in | Wt 323.8 lb

## 2020-01-29 DIAGNOSIS — G4721 Circadian rhythm sleep disorder, delayed sleep phase type: Secondary | ICD-10-CM

## 2020-01-29 DIAGNOSIS — Z6841 Body Mass Index (BMI) 40.0 and over, adult: Secondary | ICD-10-CM

## 2020-01-29 DIAGNOSIS — R0683 Snoring: Secondary | ICD-10-CM

## 2020-01-29 NOTE — Patient Instructions (Signed)
Will arrange for home sleep study Will call to arrange for follow up after sleep study reviewed  

## 2020-01-29 NOTE — Progress Notes (Signed)
[facility]  Chief Complaint  Patient presents with  . Consult    Sleep consult    Constitutional:  BP (!) 134/92 (BP Location: Left Arm, Cuff Size: Normal)   Pulse 86   Temp (!) 97.4 F (36.3 C) (Other (Comment)) Comment (Src): wrist  Ht 5\' 7"  (1.702 m)   Wt (!) 323 lb 12.8 oz (146.9 kg)   SpO2 97% Comment: Room air  BMI 50.71 kg/m   Past Medical History:  ADHD, Bipolar  Past Surgical History:  He  has a past surgical history that includes Appendectomy.  Brief Summary:  Gene Berry is a 20 y.o. male smoker with snoring.      Subjective:   He was recently seen by cardiology for assessment of chest pain and syncope. During assessment it was noted that he snores and wakes up gasping.   He has trouble sleeping on his back.  He falls asleep watching TV.  He goes to bed at 10 pm.  He doesn't feel sleepy until around 4 or 5 am.  He has to get up at 8 am, but would prefer to sleep until 12 pm.  He drinks several cups of coffee during the day to stay awake.  Not using anything to help sleep.  Feels tired and sleepy during the day.  He denies sleep walking, sleep talking, bruxism, or nightmares.  There is no history of restless legs.  He denies sleep hallucinations, sleep paralysis, or cataplexy.  The Epworth score is 16 out of 24.    Physical Exam:   Appearance - well kempt   ENMT - no sinus tenderness, no oral exudate, no LAN, Mallampati 4 airway, no stridor  Respiratory - equal breath sounds bilaterally, no wheezing or rales  CV - s1s2 regular rate and rhythm, no murmurs  Ext - no clubbing, no edema  Skin - no rashes  Psych - normal mood and affect   Sleep Tests:    Cardiac Tests:   Echo 10/08/19 >> EF 55 to 60%  Social History:  He  reports that he has been smoking cigarettes. He has a 8.00 pack-year smoking history. He has quit using smokeless tobacco.  His smokeless tobacco use included chew. He reports  previous alcohol use. He reports previous drug use.  Family History:  His family history includes Anxiety disorder in his mother; Cancer - Other in his father; Cirrhosis in his maternal grandfather; Heart attack in his maternal grandfather, maternal grandmother, paternal grandfather, and paternal grandmother; Hypertension in his father; Stroke in his maternal grandfather.    Discussion:  He has snoring, sleep disruption, apnea, and daytime sleepiness.  His BMI is > 35.  I am concerned he could have obstructive sleep apnea.  He also likely has delayed sleep phase.  Assessment/Plan:   Snoring with excessive daytime sleepiness. - will need to arrange for a home sleep study pending insurance approval  Delayed sleep phase. - will address this further after review of his sleep study  Tobacco abuse. - he will try to gradually quit   Obesity. - discussed how weight can impact sleep and risk for sleep disordered breathing - discussed options to assist with weight loss: combination of diet modification, cardiovascular and strength training exercises  Cardiovascular risk. - had an extensive discussion regarding the adverse health consequences related to untreated sleep disordered breathing - specifically discussed the risks for hypertension, coronary artery disease, cardiac dysrhythmias, cerebrovascular disease, and diabetes - lifestyle modification discussed  Safe driving practices. - discussed how sleep disruption can increase risk of accidents, particularly when driving - safe driving practices were discussed  Therapies for obstructive sleep apnea. - if the sleep study shows significant sleep apnea, then various therapies for treatment were reviewed: CPAP, oral appliance, and surgical interventions   Time Spent Involved in Patient Care on Day of Examination:  32 minutes  Follow up:  Patient Instructions  Will arrange for home sleep study Will call to arrange for follow up after  sleep study reviewed    Medication List:   Allergies as of 01/29/2020      Reactions   Augmentin [amoxicillin-pot Clavulanate] Anaphylaxis      Medication List       Accurate as of January 29, 2020 11:25 AM. If you have any questions, ask your nurse or doctor.        metoprolol tartrate 25 MG tablet Commonly known as: LOPRESSOR Take 0.5 tablets (12.5 mg total) by mouth 2 (two) times daily.       Signature:  Coralyn Helling, MD [facility] Care Pager - [phone] 01/29/2020, 11:25 AM

## 2020-02-16 ENCOUNTER — Telehealth: Payer: Self-pay | Admitting: Family Medicine

## 2020-02-16 NOTE — Telephone Encounter (Signed)
Patient will come by to get copies of office visits for (employer physical) so he can have sent or take with him to [facility]. They do the physicals for [place].

## 2020-02-25 ENCOUNTER — Ambulatory Visit: Payer: Self-pay

## 2020-02-25 ENCOUNTER — Other Ambulatory Visit: Payer: Self-pay

## 2020-02-25 DIAGNOSIS — R0683 Snoring: Secondary | ICD-10-CM

## 2020-06-14 ENCOUNTER — Ambulatory Visit: Payer: Self-pay | Admitting: Cardiology

## 2020-06-14 NOTE — Progress Notes (Deleted)
Clinical Summary Gene Berry is a 20 y.o.male  1. 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 Left sided pain, sharp stabbing 10/10. Vomited, felt weak. Stood up and troubles standing due weakness. Sat back down - recurrent pain down left arm, left arm went numb and passed out - was told at urgent care HRs were elevated.  - constant pain x 4 hours, worst with movement.  - taken to ER  - similar pain few weeks prior.  - intermittent left arm numbness since Wed. Can get heacache.  - can have sudden episodes of feeling like he may pass out. +palpitations - limited caffeine   09/2019 echo: LVEF 55-60%, normal echo 10/2019 event monitor: no significant arrhythmias, rare ectopy   2. Palpitations 10/2019 event monitor: no significant arrhythmias, rare ectopy - on lopressor   3. OSA  - seen by pulmonary, was to have a home sleep study     Past Medical History:  Diagnosis Date  . ADHD   . Bipolar affective disorder (HCC)      Allergies  Allergen Reactions  . Augmentin [Amoxicillin-Pot Clavulanate] Anaphylaxis     Current Outpatient Medications  Medication Sig Dispense Refill  . metoprolol tartrate (LOPRESSOR) 25 MG tablet Take 0.5 tablets (12.5 mg total) by mouth 2 (two) times daily. 15 tablet 6   No current facility-administered medications for this visit.     Past Surgical History:  Procedure Laterality Date  . APPENDECTOMY       Allergies  Allergen Reactions  . Augmentin [Amoxicillin-Pot Clavulanate] Anaphylaxis      Family History  Problem Relation Age of Onset  . Anxiety disorder Mother   . Hypertension Father   . Cancer - Other Father   . Heart attack Maternal Grandmother   . Heart attack Maternal Grandfather   . Cirrhosis Maternal  Grandfather   . Stroke Maternal Grandfather   . Heart attack Paternal Grandmother   . Heart attack Paternal Grandfather      Social History Mr. Whitter reports that he has been smoking cigarettes. He has a 8.00 pack-year smoking history. He has quit using smokeless tobacco.  His smokeless tobacco use included chew. Mr. Ellis reports previous alcohol use.   Review of Systems CONSTITUTIONAL: No weight loss, fever, chills, weakness or fatigue.  HEENT: Eyes: No visual loss, blurred vision, double vision or yellow sclerae.No hearing loss, sneezing, congestion, runny nose or sore throat.  SKIN: No rash or itching.  CARDIOVASCULAR:  RESPIRATORY: No shortness of breath, cough or sputum.  GASTROINTESTINAL: No anorexia, nausea, vomiting or diarrhea. No abdominal pain or blood.  GENITOURINARY: No burning on urination, no polyuria NEUROLOGICAL: No headache, dizziness, syncope, paralysis, ataxia, numbness or tingling in the extremities. No change in bowel or bladder control.  MUSCULOSKELETAL: No muscle, back pain, joint pain or stiffness.  LYMPHATICS: No enlarged nodes. No history of splenectomy.  PSYCHIATRIC: No history of depression or anxiety.  ENDOCRINOLOGIC: No reports of sweating, cold or heat intolerance. No polyuria or polydipsia.  Marland Kitchen   Physical Examination There were no vitals filed for this visit. There were no vitals filed for this visit.  Gen: resting comfortably, no acute distress HEENT: no scleral icterus, pupils equal round and reactive, no palptable  cervical adenopathy,  CV Resp: Clear to auscultation bilaterally GI: abdomen is soft, non-tender, non-distended, normal bowel sounds, no hepatosplenomegaly MSK: extremities are warm, no edema.  Skin: warm, no rash Neuro:  no focal deficits Psych: appropriate affect   Diagnostic Studies  09/2019 echo IMPRESSIONS    1. Left ventricular ejection fraction, by estimation, is 55 to 60%. The  left ventricle has normal  function. Left ventricular endocardial border  not optimally defined to evaluate regional wall motion. Left ventricular  diastolic parameters were normal.  2. Right ventricular systolic function is normal. The right ventricular  size is normal. Tricuspid regurgitation signal is inadequate for assessing  PA pressure.  3. The mitral valve is grossly normal. Trivial mitral valve  regurgitation.  4. The aortic valve is tricuspid. Aortic valve regurgitation is not  visualized.  5. The inferior vena cava is normal in size with greater than 50%  respiratory variability, suggesting right atrial pressure of 3 mmHg.      Assessment and Plan  1. Chest pain/Palpitations/Syncope/Left arm weakness - unclear etiology of this constellation of symptoms in this very young patient - will obtain echo to evaluate for any underlying cardiac dysfunction - obtain heart monitor to evaluate for significant arrhythmias - if benign cardiac workup may need neuro eval with arm weakness, syncope - no plans for ischemic testing at this time in this 20 year old patient      Antoine Poche, M.D., F.A.C.C.

## 2020-06-15 ENCOUNTER — Encounter: Payer: Self-pay | Admitting: Cardiology

## 2020-08-01 NOTE — Progress Notes (Signed)
Cardiology Office Note  Date: 08/02/2020   ID: Gene Berry, DOB 2000-10-18, MRN [id number]  PCP:  Patient, No Pcp Per (Inactive)  Cardiologist:  Dina Rich, MD Electrophysiologist:  None   Chief Complaint: 6 month follow-up  History of Present Illness: Gene Berry is a 20 y.o. male with a history of chest pain/syncope, ADHD, bipolar disease.  Last encounter with Dr. Wyline Mood 10/03/2019 status post ER visit with chest pain and syncope on 07/2019.  Troponins were negative x3, EKG showed no ischemic changes.  Chest x-ray : no acute process.  Had been at work unloading boxes in hot temperatures.  Had left-sided chest pain sharp stabbing in nature.  Vomited, felt weak. Trouble standing. Pain in left arm. Went numb and passed out. Could have sudden episodes of feeling like he may pass out. Palpitations. 14 day Zio patch was ordered. Echo ordered. No plans for ischemic work up.  At previous visit on 11/13/2019 he continued to complain of dizziness and rapid heart rate.  He stated dizziness was associated with changes in position such as going from lying to sitting to standing, or bending then attempting to stand erect with dizziness with associated increased heart rate.  He denied any significant caffeine intake, stimulant intake, recreational drugs.  Stated he had chest pain about 15 to 20 minutes after eating.  He pointed to left precordial area and s stated it radiated around to his left axillary area.  He denied any associated radiation to neck, arm, back, jaw.  No nausea, vomiting, or diaphoresis associated.  Heart rate on arrival was 116.  He stated he had a strong family history of diabetes in his father.  He admitted to frequent urination and drinking frequent liquids.  He presented 12/11/2019 for 1 month follow-up.  He stated the medication seem to be helping his heart rate and dizziness.  He had been started on metoprolol 12.5 mg p.o. twice daily.  He saw Dr Craige Cotta on 01/29/2020 for sleep  consult. Home sleep study was planned pending insurance approval. He was planning to gradually quit smoking. Weight loss was discussed. Home sleep study was apparently never done. No results in epic.   He presents today for follow-up.  He states he had another similar episode of shortness of breath, dizziness/near syncope and some chest pain with some palpitations while at work.  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  He has a history of morbid obesity current weight is 305.  Although I do not see a record of sleep study being done, patient states he did have a sleep study after seeing Dr. Craige Cotta in January.  He continues to smoke.  He has lost some weight.  In November of last year he weighed 329.  Today he weighs 305.  He denies any PND or orthopnea.  No bleeding issues.  No claudication-like symptoms, DVT or PE-like symptoms, or lower extremity edema.  He has significant morbid obesity with a BMI of 45.    Past Medical History:  Diagnosis Date   ADHD    Bipolar affective disorder [facility])     Past Surgical History:  Procedure Laterality Date   APPENDECTOMY      Current Outpatient Medications  Medication Sig Dispense Refill   metoprolol tartrate (LOPRESSOR) 25 MG tablet Take 0.5 tablets (12.5 mg total) by mouth 2 (two) times daily. 15 tablet 6   No current facility-administered medications  for this visit.   Allergies:  Augmentin [amoxicillin-pot clavulanate]   Social History: The patient  reports that he has been smoking cigarettes. He has a 8.00 pack-year smoking history. He has quit using smokeless tobacco.  His smokeless tobacco use included chew. He reports previous alcohol use. He reports previous drug use.   Family History: The patient's family history includes Anxiety disorder in his mother; Cancer - Other in his father; Cirrhosis in his maternal grandfather; Heart attack in his maternal  grandfather, maternal grandmother, paternal grandfather, and paternal grandmother; Hypertension in his father; Stroke in his maternal grandfather.   ROS:  Please see the history of present illness. Otherwise, complete review of systems is positive for none.  All other systems are reviewed and negative.   Physical Exam: VS:  BP 114/78   Pulse (!) 105   Ht 5\' 9"  (1.753 m)   Wt (!) 305 lb 3.2 oz (138.4 kg)   SpO2 96%   BMI 45.07 kg/m , BMI Body mass index is 45.07 kg/m.  Wt Readings from Last 3 Encounters:  08/02/20 (!) 305 lb 3.2 oz (138.4 kg)  01/29/20 (!) 323 lb 12.8 oz (146.9 kg)  12/11/19 (!) 329 lb (149.2 kg) (>99 %, Z= 3.36)*   * Growth percentiles are based on CDC (Boys, 2-20 Years) data.    General: Morbidly obese patient appears comfortable at rest. Neck: Supple, no elevated JVP or carotid bruits, no thyromegaly. Lungs: Clear to auscultation, nonlabored breathing at rest. Cardiac: Tachycardic rate and rhythm, no S3 or significant systolic murmur, no pericardial rub. Extremities: No pitting edema, distal pulses 2+. Skin: Warm and dry. Musculoskeletal: No kyphosis. Neuropsychiatric: Alert and oriented x3, affect grossly appropriate.  ECG:    Recent Labwork: 11/14/2019: BUN 19; Creatinine, Ser 0.82; Hemoglobin 16.1; Platelets 319; Potassium 3.9; Sodium 137; TSH 1.381  No results found for: CHOL, TRIG, HDL, CHOLHDL, VLDL, LDLCALC, LDLDIRECT  Other Studies Reviewed Today:  Cardiac monitor 10/28/2019 Study Highlights  9 days 14 hours of monitored data Min HR 43, Max HR 177, Avg HR 91 Rare supraventricular ectopy in the form of isolated PACs, couplets Rare ventricular ectopy in the form of isolated PVCs Symptoms correlate with sinus rhythm and mild sinus tachycardia   Echocardiogram 10/08/2019 1. Left ventricular ejection fraction, by estimation, is 55 to 60%. The left ventricle has normal function. Left ventricular endocardial border not optimally defined to evaluate  regional wall motion. Left ventricular diastolic parameters were normal. 2. Right ventricular systolic function is normal. The right ventricular size is normal. Tricuspid regurgitation signal is inadequate for assessing PA pressure. 3. The mitral valve is grossly normal. Trivial mitral valve regurgitation. 4. The aortic valve is tricuspid. Aortic valve regurgitation is not visualized. 5. The inferior vena cava is normal in size with greater than 50% respiratory variability, suggesting right atrial pressure of 3 mmHg.  Assessment and Plan:   1. Chest pain of uncertain etiology Currently denies any anginal symptoms.  Recently had an episode where he works which was similar to prior episode with chest pain, dizziness, palpitations, sensation of near syncope.  Please get an exercise treadmill stress test.  2. Syncope, unspecified syncope type He states today he had another episode at work with chest pain, dizziness, palpitations, sensation of near syncope.  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  3. Palpitations He states he had another recent episode at work with sensation of palpitations/rapid heart rate associated with  chest pain, dizziness, and near syncopal sensation.  Continue metoprolol 12.5 mg p.o. twice daily.  Please get a repeat since 14-day ZIO monitor  4.  Suspected sleep apnea  Patient had a sleep consult with Dr. Vassie Loll in January who ordered a home sleep study.  Patient states he had the sleep study but there are no results in epic to reflect the study being done.  We will call Dr. Reginia Naas office for further information regarding results of sleep study.    Medication Adjustments/Labs and Tests Ordered: Current medicines are reviewed at length with the patient today.  Concerns regarding medicines are outlined above.   Disposition: Follow-up with Dr. Wyline Mood or APP 6 to 8 weeks  Signed, Rennis Harding, NP 08/02/2020  3:40 PM    [facility] at [facility] [address] [place], [place], [place] [address] Phone: [phone]; Fax: [phone]

## 2020-08-02 ENCOUNTER — Encounter: Payer: Self-pay | Admitting: *Deleted

## 2020-08-02 ENCOUNTER — Other Ambulatory Visit: Payer: Self-pay

## 2020-08-02 ENCOUNTER — Encounter: Payer: Self-pay | Admitting: Family Medicine

## 2020-08-02 ENCOUNTER — Ambulatory Visit (INDEPENDENT_AMBULATORY_CARE_PROVIDER_SITE_OTHER): Payer: Self-pay | Admitting: Family Medicine

## 2020-08-02 VITALS — BP 114/78 | HR 105 | Ht 69.0 in | Wt 305.2 lb

## 2020-08-02 DIAGNOSIS — R002 Palpitations: Secondary | ICD-10-CM

## 2020-08-02 DIAGNOSIS — R29818 Other symptoms and signs involving the nervous system: Secondary | ICD-10-CM

## 2020-08-02 DIAGNOSIS — R55 Syncope and collapse: Secondary | ICD-10-CM

## 2020-08-02 DIAGNOSIS — R079 Chest pain, unspecified: Secondary | ICD-10-CM

## 2020-08-02 NOTE — Patient Instructions (Signed)
Medication Instructions:  Continue all current medications.  Labwork: none  Testing/Procedures: Your physician has recommended that you wear a 14 day event monitor. Event monitors are medical devices that record the heart's electrical activity. Doctors most often [place] these monitors to diagnose arrhythmias. Arrhythmias are problems with the speed or rhythm of the heartbeat. The monitor is a small, portable device. You can wear one while you do your normal daily activities. This is usually used to diagnose what is causing palpitations/syncope (passing out). Your physician has requested that you have an exercise stress myoview. For further information please visit [email or web address] Please follow instruction sheet, as given.  Office will contact with results via phone or letter.    Follow-Up: 6-8 weeks   Any Other Special Instructions Will Be Listed Below (If Applicable). Out of work note given till end of the week.   If you need a refill on your cardiac medications before your next appointment, please call your pharmacy.

## 2020-08-06 ENCOUNTER — Other Ambulatory Visit: Payer: Self-pay | Admitting: *Deleted

## 2020-08-06 ENCOUNTER — Other Ambulatory Visit: Payer: Self-pay | Admitting: Cardiology

## 2020-08-06 ENCOUNTER — Ambulatory Visit (INDEPENDENT_AMBULATORY_CARE_PROVIDER_SITE_OTHER): Payer: Self-pay

## 2020-08-06 ENCOUNTER — Telehealth: Payer: Self-pay | Admitting: Family Medicine

## 2020-08-06 DIAGNOSIS — R002 Palpitations: Secondary | ICD-10-CM

## 2020-08-06 NOTE — Telephone Encounter (Signed)
Patient called said Dr released him to go back to work Mon 7/18 but they will not let him return without having a physical paper completed. He wanted to get appt for new Friday. I tried to get him to come in today with Mardelle Matte at 2 but he declined. Said he was 2hrs away. He will bring by the physical paperwork he said on Monday. He can be reached at [phone].

## 2020-08-09 ENCOUNTER — Other Ambulatory Visit: Payer: Self-pay | Admitting: *Deleted

## 2020-08-09 DIAGNOSIS — R079 Chest pain, unspecified: Secondary | ICD-10-CM

## 2020-08-09 DIAGNOSIS — R002 Palpitations: Secondary | ICD-10-CM

## 2020-08-10 ENCOUNTER — Telehealth: Payer: Self-pay | Admitting: Family Medicine

## 2020-08-10 NOTE — Telephone Encounter (Signed)
Patient called said he has his heart monitor and he needs a note from Rennis Harding saying he can not work. 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 He can be reached at [phone].

## 2020-08-10 NOTE — Telephone Encounter (Signed)
Forward to provider for review .

## 2020-08-11 ENCOUNTER — Encounter: Payer: Self-pay | Admitting: *Deleted

## 2020-08-11 NOTE — Telephone Encounter (Signed)
Patient notified and verbalized understanding.  States that he placed the monitor or Monday, 08/09/2020.  He will remove on Monday, 08/23/2020.    Letter done & sent to patient via my chart.

## 2020-08-26 ENCOUNTER — Other Ambulatory Visit: Payer: Self-pay | Admitting: *Deleted

## 2020-08-26 DIAGNOSIS — R079 Chest pain, unspecified: Secondary | ICD-10-CM

## 2020-08-27 ENCOUNTER — Telehealth: Payer: Self-pay | Admitting: *Deleted

## 2020-08-27 ENCOUNTER — Ambulatory Visit (HOSPITAL_COMMUNITY)
Admission: RE | Admit: 2020-08-27 | Discharge: 2020-08-27 | Disposition: A | Discharge status: Home / Self Care | Payer: Medicaid Other | Source: Ambulatory Visit | Attending: Family Medicine | Admitting: Family Medicine

## 2020-08-27 ENCOUNTER — Encounter (HOSPITAL_COMMUNITY): Payer: Self-pay

## 2020-08-27 ENCOUNTER — Encounter (HOSPITAL_COMMUNITY)
Admission: RE | Admit: 2020-08-27 | Discharge: 2020-08-27 | Disposition: A | Discharge status: Home / Self Care | Payer: Medicaid Other | Source: Ambulatory Visit | Attending: Family Medicine | Admitting: Family Medicine

## 2020-08-27 ENCOUNTER — Other Ambulatory Visit: Payer: Self-pay

## 2020-08-27 DIAGNOSIS — R079 Chest pain, unspecified: Secondary | ICD-10-CM | POA: Diagnosis present

## 2020-08-27 LAB — NM MYOCAR MULTI W/SPECT W/WALL MOTION / EF
LV dias vol: 109 mL (ref 62–150)
LV sys vol: 52 mL
Peak HR: 113 {beats}/min
RATE: 0.33
Rest HR: 88 {beats}/min
SDS: 1
SRS: 2
SSS: 3
TID: 1.32

## 2020-08-27 MED ORDER — SODIUM CHLORIDE FLUSH 0.9 % IV SOLN
INTRAVENOUS | Status: AC
  Administered 2020-08-27: 10 mL via INTRAVENOUS
  Filled 2020-08-27: qty 10

## 2020-08-27 MED ORDER — TECHNETIUM TC 99M TETROFOSMIN IV KIT
30.0000 | PACK | Freq: Once | INTRAVENOUS | Status: AC | PRN
  Administered 2020-08-27: 30.4 via INTRAVENOUS

## 2020-08-27 MED ORDER — REGADENOSON 0.4 MG/5ML IV SOLN
INTRAVENOUS | Status: AC
  Administered 2020-08-27: 0.4 mg via INTRAVENOUS
  Filled 2020-08-27: qty 5

## 2020-08-27 MED ORDER — TECHNETIUM TC 99M TETROFOSMIN IV KIT
10.0000 | PACK | Freq: Once | INTRAVENOUS | Status: AC | PRN
  Administered 2020-08-27: 9.9 via INTRAVENOUS

## 2020-08-27 NOTE — Telephone Encounter (Signed)
Lesle Chris, LPN  04/26/3152  0:08 PM EDT Back to Top     Correction - no pcp.    Lesle Chris, LPN  06/29/6193  0:93 PM EDT      Notified, copy to pcp.    Netta Neat., NP  08/27/2020  4:03 PM EDT      Please call the patient and let him know the stress test was considered normal.  Considered to be a low risk stress test.  There was no evidence of lack ofblood flow through the coronary arteries.     Netta Neat, NP  08/27/2020 4:02 PM

## 2020-08-27 NOTE — Telephone Encounter (Signed)
Patient wants to know when he is cleared to go back to work.  Stress test normal.  Still waiting on 14 day zio results.  He mailed back on 8/1 - site is showing processing start date of 08/27/2020.  His next follow up is scheduled for 09/24/2020.  If not cleared yet due to not having monitor results back yet, he states he will need work note to be out till then.

## 2020-08-30 ENCOUNTER — Encounter: Payer: Self-pay | Admitting: *Deleted

## 2020-08-30 NOTE — Telephone Encounter (Signed)
Patient notified and verbalized understanding.   Letter done - he will try to retrieve via my chart.  If not, he will let us know & I will print for him to pick up from office.

## 2020-09-24 ENCOUNTER — Ambulatory Visit: Payer: Self-pay | Admitting: Family Medicine

## 2020-10-07 ENCOUNTER — Ambulatory Visit: Payer: Medicaid Other | Admitting: Family Medicine

## 2020-10-07 NOTE — Progress Notes (Deleted)
Cardiology Office Note  Date: 10/07/2020   ID: Gene Berry, DOB May 25, 2000, MRN [id number]  PCP:  Patient, No Pcp Per (Inactive)  Cardiologist:  Dina Rich, MD Electrophysiologist:  None   Chief Complaint: 6 month follow-up  History of Present Illness: Gene Berry is a 20 y.o. male with a history of chest pain/syncope, ADHD, bipolar disease.  Last encounter with Dr. Wyline Mood 10/03/2019 status post ER visit with chest pain and syncope on 07/2019.  Troponins were negative x3, EKG showed no ischemic changes.  Chest x-ray : no acute process.  Had been at work unloading boxes in hot temperatures.  Had left-sided chest pain sharp stabbing in nature.  Vomited, felt weak. Trouble standing. Pain in left arm. Went numb and passed out. Could have sudden episodes of feeling like he may pass out. Palpitations. 14 day Zio patch was ordered. Echo ordered. No plans for ischemic work up.  At previous visit on 11/13/2019 he continued to complain of dizziness and rapid heart rate.  He stated dizziness was associated with changes in position such as going from lying to sitting to standing, or bending then attempting to stand erect with dizziness with associated increased heart rate.  He denied any significant caffeine intake, stimulant intake, recreational drugs.  Stated he had chest pain about 15 to 20 minutes after eating.  He pointed to left precordial area and s stated it radiated around to his left axillary area.  He denied any associated radiation to neck, arm, back, jaw.  No nausea, vomiting, or diaphoresis associated.  Heart rate on arrival was 116.  He stated he had a strong family history of diabetes in his father.  He admitted to frequent urination and drinking frequent liquids.  He presented 12/11/2019 for 1 month follow-up.  He stated the medication seem to be helping his heart rate and dizziness.  He had been started on metoprolol 12.5 mg p.o. twice daily.  He saw Dr Craige Cotta on 01/29/2020 for sleep  consult. Home sleep study was planned pending insurance approval. He was planning to gradually quit smoking. Weight loss was discussed. Home sleep study was apparently never done. No results in epic.   He presents today for follow-up.  He states he had another similar episode of shortness of breath, dizziness/near syncope and some chest pain with some palpitations while at work.  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  He has a history of morbid obesity current weight is 305.  Although I do not see a record of sleep study being done, patient states he did have a sleep study after seeing Dr. Craige Cotta in January.  He continues to smoke.  He has lost some weight.  In November of last year he weighed 329.  Today he weighs 305.  He denies any PND or orthopnea.  No bleeding issues.  No claudication-like symptoms, DVT or PE-like symptoms, or lower extremity edema.  He has significant morbid obesity with a BMI of 45.    Zio and stress test. Sleep study ?  Past Medical History:  Diagnosis Date   ADHD    Bipolar affective disorder [facility])     Past Surgical History:  Procedure Laterality Date   APPENDECTOMY      Current Outpatient Medications  Medication Sig Dispense Refill   metoprolol tartrate (LOPRESSOR) 25 MG tablet Take 0.5 tablets (12.5 mg total) by mouth 2 (two) times daily. 15  tablet 6   No current facility-administered medications for this visit.   Allergies:  Augmentin [amoxicillin-pot clavulanate]   Social History: The patient  reports that he has been smoking cigarettes. He has a 8.00 pack-year smoking history. He has quit using smokeless tobacco.  His smokeless tobacco use included chew. He reports that he does not currently use alcohol. He reports that he does not currently use drugs.   Family History: The patient's family history includes Anxiety disorder in his mother; Cancer - Other in his  father; Cirrhosis in his maternal grandfather; Heart attack in his maternal grandfather, maternal grandmother, paternal grandfather, and paternal grandmother; Hypertension in his father; Stroke in his maternal grandfather.   ROS:  Please see the history of present illness. Otherwise, complete review of systems is positive for none.  All other systems are reviewed and negative.   Physical Exam: VS:  There were no vitals taken for this visit., BMI There is no height or weight on file to calculate BMI.  Wt Readings from Last 3 Encounters:  08/02/20 (!) 305 lb 3.2 oz (138.4 kg)  01/29/20 (!) 323 lb 12.8 oz (146.9 kg)  12/11/19 (!) 329 lb (149.2 kg) (>99 %, Z= 3.36)*   * Growth percentiles are based on CDC (Boys, 2-20 Years) data.    General: Morbidly obese patient appears comfortable at rest. Neck: Supple, no elevated JVP or carotid bruits, no thyromegaly. Lungs: Clear to auscultation, nonlabored breathing at rest. Cardiac: Tachycardic rate and rhythm, no S3 or significant systolic murmur, no pericardial rub. Extremities: No pitting edema, distal pulses 2+. Skin: Warm and dry. Musculoskeletal: No kyphosis. Neuropsychiatric: Alert and oriented x3, affect grossly appropriate.  ECG:    Recent Labwork: 11/14/2019: BUN 19; Creatinine, Ser 0.82; Hemoglobin 16.1; Platelets 319; Potassium 3.9; Sodium 137; TSH 1.381  No results found for: CHOL, TRIG, HDL, CHOLHDL, VLDL, LDLCALC, LDLDIRECT  Other Studies Reviewed Today:   Zio monitor 08/06/2020 Patch Wear Time:  11 days and 10 hours (2022-07-18T21:36:19-0400 to 2022-07-30T08:11:00-0400)   Patient had a min HR of 40 bpm, max HR of 175 bpm, and avg HR of 85 bpm. Predominant underlying rhythm was Sinus Rhythm. Isolated SVEs were rare (<1.0%), and no SVE Couplets or SVE Triplets were present. No Isolated VEs, VE Couplets, or VE Triplets were  present.       Nuclear stress test 08/27/2020  Study Result  Narrative & Impression  There was no  ST segment deviation noted during stress. The study is normal. There are no perfusion defects consistent with prior infarct or current ischemia. This is a low risk study. The left ventricular ejection fraction is normal (55-65%).         Cardiac monitor 10/28/2019 Study Highlights  9 days 14 hours of monitored data Min HR 43, Max HR 177, Avg HR 91 Rare supraventricular ectopy in the form of isolated PACs, couplets Rare ventricular ectopy in the form of isolated PVCs Symptoms correlate with sinus rhythm and mild sinus tachycardia   Echocardiogram 10/08/2019 1. Left ventricular ejection fraction, by estimation, is 55 to 60%. The left ventricle has normal function. Left ventricular endocardial border not optimally defined to evaluate regional wall motion. Left ventricular diastolic parameters were normal. 2. Right ventricular systolic function is normal. The right ventricular size is normal. Tricuspid regurgitation signal is inadequate for assessing PA pressure. 3. The mitral valve is grossly normal. Trivial mitral valve regurgitation. 4. The aortic valve is tricuspid. Aortic valve regurgitation is not visualized. 5. The inferior vena cava  is normal in size with greater than 50% respiratory variability, suggesting right atrial pressure of 3 mmHg.  Assessment and Plan:   1. Chest pain of uncertain etiology Currently denies any anginal symptoms.  Recently had an episode where he works which was similar to prior episode with chest pain, dizziness, palpitations, sensation of near syncope.  Please get an exercise treadmill stress test.  2. Syncope, unspecified syncope type He states today he had another episode at work with chest pain, dizziness, palpitations, sensation of near syncope.  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  3. Palpitations He states he had another recent episode at work with sensation of  palpitations/rapid heart rate associated with chest pain, dizziness, and near syncopal sensation.  Continue metoprolol 12.5 mg p.o. twice daily.  Please get a repeat since 14-day ZIO monitor  4.  Suspected sleep apnea  Patient had a sleep consult with Dr. Vassie Loll in January who ordered a home sleep study.  Patient states he had the sleep study but there are no results in epic to reflect the study being done.  We will call Dr. Reginia Naas office for further information regarding results of sleep study.    Medication Adjustments/Labs and Tests Ordered: Current medicines are reviewed at length with the patient today.  Concerns regarding medicines are outlined above.   Disposition: Follow-up with Dr. Wyline Mood or APP 6 to 8 weeks  Signed, Rennis Harding, NP 10/07/2020 12:05 AM    [facility] at [facility] [address] [place], [place], [place] [address] Phone: [phone]; Fax: [phone]

## 2020-10-25 ENCOUNTER — Telehealth: Payer: Self-pay | Admitting: *Deleted

## 2020-10-25 NOTE — Telephone Encounter (Signed)
Lesle Chris, LPN  62/08/6379  8:57 AM EDT Back to Top    Notified, NO PCP

## 2020-10-25 NOTE — Telephone Encounter (Signed)
-----   Message from Netta Neat., NP sent at 10/21/2020  8:08 PM EDT ----- Please call the patient and let him know the heart monitor showed he had some rare premature beats. The reported symptoms were associated with a normal rhythm and rare premature beat. There were no significant arrhythmias.  Netta Neat, NP  10/21/2020 8:05 PM

## 2020-10-28 NOTE — Progress Notes (Addendum)
Cardiology Office Note  Date: 10/29/2020   ID: Gene Berry, DOB March 26, 2000, MRN [id number]  PCP:  Patient, No Pcp Per (Inactive)  Cardiologist:  Dina Rich, MD Electrophysiologist:  None   Chief Complaint: 6 month follow-up  History of Present Illness: Gene Berry is a 20 y.o. male with a history of chest pain/syncope, ADHD, bipolar disease.  Last encounter with Dr. Wyline Mood 10/03/2019 status post ER visit with chest pain and syncope on 07/2019.  Troponins were negative x3, EKG showed no ischemic changes.  Chest x-ray : no acute process.  Had been at work unloading boxes in hot temperatures.  Had left-sided chest pain sharp stabbing in nature.  Vomited, felt weak. Trouble standing. Pain in left arm. Went numb and passed out. Could have sudden episodes of feeling like he may pass out. Palpitations. 14 day Zio patch was ordered. Echo ordered. No plans for ischemic work up.  At previous visit on 11/13/2019 he continued to complain of dizziness and rapid heart rate.  He stated dizziness was associated with changes in position such as going from lying to sitting to standing, or bending then attempting to stand erect with dizziness with associated increased heart rate.  He denied any significant caffeine intake, stimulant intake, recreational drugs.  Stated he had chest pain about 15 to 20 minutes after eating.  He pointed to left precordial area and s stated it radiated around to his left axillary area.  He denied any associated radiation to neck, arm, back, jaw.  No nausea, vomiting, or diaphoresis associated.  Heart rate on arrival was 116.  He stated he had a strong family history of diabetes in his father.  He admitted to frequent urination and drinking frequent liquids.  He presented 12/11/2019 for 1 month follow-up.  He stated the medication seem to be helping his heart rate and dizziness.  He had been started on metoprolol 12.5 mg p.o. twice daily.  He saw Dr Craige Cotta on 01/29/2020 for sleep  consult. Home sleep study was planned pending insurance approval. He was planning to gradually quit smoking. Weight loss was discussed. Home sleep study was apparently never done. No results in epic.   He is here today for follow-up for recent ZIO monitor results and stress test.  ZIO monitor showed no significant arrhythmias.  He denies any current issues.  No chest pain, pressure, tightness, palpitations, orthostatic symptoms, presyncope or syncope.  No PND, orthopnea, no bleeding, no claudication-like symptoms, DVT or PE-like symptoms, or lower extremity edema.  Apparently when looking in the notes with nursing staff the patient had inadequate information in his home sleep study and was requested to have another home sleep study.  He has not followed up with pulmonary regarding this.  He states he was unaware.  Nursing staff state he has not been using his metoprolol.  Stress test did not show any evidence of ischemia.  It was considered a  low risk study.   Past Medical History:  Diagnosis Date   ADHD    Bipolar affective disorder [facility])     Past Surgical History:  Procedure Laterality Date   APPENDECTOMY      Current Outpatient Medications  Medication Sig Dispense Refill   metoprolol tartrate (LOPRESSOR) 25 MG tablet Take 0.5 tablets (12.5 mg total) by mouth 2 (two) times daily. 30 tablet 6   No current facility-administered medications for this visit.   Allergies:  Augmentin [amoxicillin-pot clavulanate]   Social History: The patient  reports that he quit  smoking about a year ago. His smoking use included cigarettes. He has a 8.00 pack-year smoking history. He has quit using smokeless tobacco.  His smokeless tobacco use included chew. He reports that he does not currently use alcohol. He reports that he does not currently use drugs.   Family History: The patient's family history includes Anxiety disorder in his mother; Cancer - Other in his father; Cirrhosis in his maternal  grandfather; Heart attack in his maternal grandfather, maternal grandmother, paternal grandfather, and paternal grandmother; Hypertension in his father; Stroke in his maternal grandfather.   ROS:  Please see the history of present illness. Otherwise, complete review of systems is positive for none.  All other systems are reviewed and negative.   Physical Exam: VS:  BP 112/72   Pulse 76   Ht 5\' 9"  (1.753 m)   Wt (!) 308 lb 3.2 oz (139.8 kg)   SpO2 98%   BMI 45.51 kg/m , BMI Body mass index is 45.51 kg/m.  Wt Readings from Last 3 Encounters:  10/29/20 (!) 308 lb 3.2 oz (139.8 kg)  08/02/20 (!) 305 lb 3.2 oz (138.4 kg)  01/29/20 (!) 323 lb 12.8 oz (146.9 kg)    General: Morbidly obese patient appears comfortable at rest. Neck: Supple, no elevated JVP or carotid bruits, no thyromegaly. Lungs: Clear to auscultation, nonlabored breathing at rest. Cardiac: Tachycardic rate and rhythm, no S3 or significant systolic murmur, no pericardial rub. Extremities: No pitting edema, distal pulses 2+. Skin: Warm and dry. Musculoskeletal: No kyphosis. Neuropsychiatric: Alert and oriented x3, affect grossly appropriate.  ECG:    Recent Labwork: 11/14/2019: BUN 19; Creatinine, Ser 0.82; Hemoglobin 16.1; Platelets 319; Potassium 3.9; Sodium 137; TSH 1.381  No results found for: CHOL, TRIG, HDL, CHOLHDL, VLDL, LDLCALC, LDLDIRECT  Other Studies Reviewed Today:   Zio monitor 08/06/2020 Patch Wear Time:  11 days and 10 hours (2022-07-18T21:36:19-0400 to 2022-07-30T08:11:00-0400)   Patient had a min HR of 40 bpm, max HR of 175 bpm, and avg HR of 85 bpm. Predominant underlying rhythm was Sinus Rhythm. Isolated SVEs were rare (<1.0%), and no SVE Couplets or SVE Triplets were present. No Isolated VEs, VE Couplets, or VE Triplets were  present.    Nuclear stress test 08/27/2020  Study Result  Narrative & Impression  There was no ST segment deviation noted during stress. The study is normal. There are  no perfusion defects consistent with prior infarct or current ischemia. This is a low risk study. The left ventricular ejection fraction is normal (55-65%).     Cardiac monitor 10/28/2019 Study Highlights  9 days 14 hours of monitored data Min HR 43, Max HR 177, Avg HR 91 Rare supraventricular ectopy in the form of isolated PACs, couplets Rare ventricular ectopy in the form of isolated PVCs Symptoms correlate with sinus rhythm and mild sinus tachycardia   Echocardiogram 10/08/2019 1. Left ventricular ejection fraction, by estimation, is 55 to 60%. The left ventricle has normal function. Left ventricular endocardial border not optimally defined to evaluate regional wall motion. Left ventricular diastolic parameters were normal. 2. Right ventricular systolic function is normal. The right ventricular size is normal. Tricuspid regurgitation signal is inadequate for assessing PA pressure. 3. The mitral valve is grossly normal. Trivial mitral valve regurgitation. 4. The aortic valve is tricuspid. Aortic valve regurgitation is not visualized. 5. The inferior vena cava is normal in size with greater than 50% respiratory variability, suggesting right atrial pressure of 3 mmHg.  Assessment and Plan:   1.  Chest pain of uncertain etiology Currently denies any anginal symptoms.  Recent stress test was low risk..  2. Syncope, unspecified syncope type No recent presyncopal or syncopal episodes..  3. Palpitations Recent 14-day ZIO monitor showed no evidence of significant arrhythmias.  Currently no complaints of arrhythmias.  Nursing staff state he has not been taken his metoprolol.   4.  Suspected sleep apnea  Patient had a sleep consult with Dr. Vassie Loll in January who ordered a home sleep study.  Patient states he had the sleep study but there are no results in epic to reflect the study being done.  When reviewing notes with nursing staff this morning it appears he did have a sleep study which  had an adequate information and needed a repeat study.  He states no one has called him about this.   Medication Adjustments/Labs and Tests Ordered: Current medicines are reviewed at length with the patient today.  Concerns regarding medicines are outlined above.   Disposition: Follow-up with Dr. Wyline Mood or APP 1 year  Signed, Rennis Harding, NP 10/29/2020 8:48 AM    [facility] at [facility] [address] [place], [place], [place] [address] Phone: [phone]; Fax: [phone]

## 2020-10-29 ENCOUNTER — Encounter: Payer: Self-pay | Admitting: Family Medicine

## 2020-10-29 ENCOUNTER — Ambulatory Visit (INDEPENDENT_AMBULATORY_CARE_PROVIDER_SITE_OTHER): Payer: Medicaid Other | Admitting: Family Medicine

## 2020-10-29 VITALS — BP 112/72 | HR 76 | Ht 69.0 in | Wt 308.2 lb

## 2020-10-29 DIAGNOSIS — R079 Chest pain, unspecified: Secondary | ICD-10-CM | POA: Diagnosis not present

## 2020-10-29 DIAGNOSIS — R55 Syncope and collapse: Secondary | ICD-10-CM

## 2020-10-29 DIAGNOSIS — R29818 Other symptoms and signs involving the nervous system: Secondary | ICD-10-CM | POA: Diagnosis not present

## 2020-10-29 DIAGNOSIS — R002 Palpitations: Secondary | ICD-10-CM

## 2020-10-29 MED ORDER — METOPROLOL TARTRATE 25 MG PO TABS
12.5000 mg | ORAL_TABLET | Freq: Two times a day (BID) | ORAL | 6 refills | Status: DC
Start: 2020-10-29 — End: 2021-11-21

## 2020-10-29 NOTE — Patient Instructions (Addendum)
Medication Instructions:  Your physician recommends that you continue on your current medications as directed. Please refer to the Current Medication list given to you today.  Labwork: none  Testing/Procedures: none  Follow-Up: Your physician recommends that you schedule a follow-up appointment in: 1 year. You will receive a reminder call or letter in the mail in about 10 months reminding you to call and schedule your appointment. If you don't receive this letter, please contact our office.  Any Other Special Instructions Will Be Listed Below (If Applicable). Call [phone] for assistance with finding a family doctor  If you need a refill on your cardiac medications before your next appointment, please call your pharmacy.

## 2020-11-01 ENCOUNTER — Ambulatory Visit: Payer: Medicaid Other | Admitting: Family Medicine

## 2020-12-01 ENCOUNTER — Encounter: Payer: Self-pay | Admitting: Emergency Medicine

## 2020-12-01 ENCOUNTER — Ambulatory Visit: Payer: Self-pay

## 2020-12-01 ENCOUNTER — Ambulatory Visit
Admission: EM | Admit: 2020-12-01 | Discharge: 2020-12-01 | Disposition: A | Discharge status: Home / Self Care | Payer: Medicaid Other | Attending: Internal Medicine | Admitting: Internal Medicine

## 2020-12-01 ENCOUNTER — Other Ambulatory Visit: Payer: Self-pay

## 2020-12-01 DIAGNOSIS — J029 Acute pharyngitis, unspecified: Secondary | ICD-10-CM | POA: Insufficient documentation

## 2020-12-01 DIAGNOSIS — J101 Influenza due to other identified influenza virus with other respiratory manifestations: Secondary | ICD-10-CM | POA: Insufficient documentation

## 2020-12-01 HISTORY — DX: Cardiac arrhythmia, unspecified: I49.9

## 2020-12-01 LAB — POCT RAPID STREP A (OFFICE): Rapid Strep A Screen: NEGATIVE

## 2020-12-01 LAB — POCT INFLUENZA A/B
Influenza A, POC: POSITIVE — AB
Influenza B, POC: NEGATIVE

## 2020-12-01 MED ORDER — OSELTAMIVIR PHOSPHATE 75 MG PO CAPS: 75.0000 mg | ORAL_CAPSULE | Freq: Two times a day (BID) | ORAL | 0 refills | Status: DC

## 2020-12-01 NOTE — ED Provider Notes (Signed)
[facility]    CSN: [id number] Arrival date & time: 12/01/20  1449      History   Chief Complaint Chief Complaint  Patient presents with   Sore Throat   Otalgia   Appointment    HPI Gene Berry is a 20 y.o. male.   Patient presents with sore throat, nasal congestion, fever, headache, bilateral ear pain that started approximately 3 days ago.  T-max at home was 102.  Denies any known sick contacts.  Denies chest pain, shortness of breath, nausea, vomiting, abdominal pain, diarrhea.  Patient has taken Tylenol to reduce fever but no other medications.   Sore Throat  Otalgia  Past Medical History:  Diagnosis Date   ADHD    Bipolar affective disorder (HCC)    Irregular heartbeat     There are no problems to display for this patient.   Past Surgical History:  Procedure Laterality Date   APPENDECTOMY         Home Medications    Prior to Admission medications   Medication Sig Start Date End Date Taking? Authorizing Provider  oseltamivir (TAMIFLU) 75 MG capsule Take 1 capsule (75 mg total) by mouth every 12 (twelve) hours. 12/01/20  Yes , Rolly Salter E, FNP  metoprolol tartrate (LOPRESSOR) 25 MG tablet Take 0.5 tablets (12.5 mg total) by mouth 2 (two) times daily. 10/29/20   Netta Neat., NP    Family History Family History  Problem Relation Age of Onset   Anxiety disorder Mother    Hypertension Father    Cancer - Other Father    Heart attack Maternal Grandmother    Heart attack Maternal Grandfather    Cirrhosis Maternal Grandfather    Stroke Maternal Grandfather    Heart attack Paternal Grandmother    Heart attack Paternal Grandfather     Social History Social History   Tobacco Use   Smoking status: Former    Packs/day: 2.00    Years: 4.00    Pack years: 8.00    Types: Cigarettes    Quit date: 10/27/2019    Years since quitting: 1.0   Smokeless tobacco: Former    Types: Chew   Tobacco comments:    smokes 5-6 cigarettes per  day, vapes all other times-01/29/2020  Vaping Use   Vaping Use: Every day  Substance Use Topics   Alcohol use: Not Currently   Drug use: Not Currently     Allergies   Augmentin [amoxicillin-pot clavulanate]   Review of Systems Review of Systems Per HPI  Physical Exam Triage Vital Signs ED Triage Vitals  Enc Vitals Group     BP 12/01/20 1536 117/86     Pulse Rate 12/01/20 1536 (!) 116     Resp 12/01/20 1536 20     Temp 12/01/20 1536 98.6 F (37 C)     Temp Source 12/01/20 1536 Oral     SpO2 12/01/20 1536 97 %     Weight --      Height --      Head Circumference --      Peak Flow --      Pain Score 12/01/20 1540 8     Pain Loc --      Pain Edu? --      Excl. in GC? --    No data found.  Updated Vital Signs BP 117/86 (BP Location: Right Arm)   Pulse (!) 116   Temp 98.6 F (37 C) (Oral)   Resp 20  SpO2 97%   Visual Acuity Right Eye Distance:   Left Eye Distance:   Bilateral Distance:    Right Eye Near:   Left Eye Near:    Bilateral Near:     Physical Exam Constitutional:      General: He is not in acute distress.    Appearance: Normal appearance. He is not toxic-appearing or diaphoretic.  HENT:     Head: Normocephalic and atraumatic.     Right Ear: Tympanic membrane and ear canal normal.     Left Ear: Tympanic membrane and ear canal normal.     Nose: Congestion present.     Mouth/Throat:     Mouth: Mucous membranes are moist.     Pharynx: Posterior oropharyngeal erythema present.  Eyes:     Extraocular Movements: Extraocular movements intact.     Conjunctiva/sclera: Conjunctivae normal.     Pupils: Pupils are equal, round, and reactive to light.  Cardiovascular:     Rate and Rhythm: Normal rate and regular rhythm.     Pulses: Normal pulses.     Heart sounds: Normal heart sounds.  Pulmonary:     Effort: Pulmonary effort is normal. No respiratory distress.     Breath sounds: Normal breath sounds. No wheezing.  Abdominal:     General: Abdomen is  flat. Bowel sounds are normal.     Palpations: Abdomen is soft.  Musculoskeletal:        General: Normal range of motion.     Cervical back: Normal range of motion.  Skin:    General: Skin is warm and dry.  Neurological:     General: No focal deficit present.     Mental Status: He is alert and oriented to person, place, and time. Mental status is at baseline.  Psychiatric:        Mood and Affect: Mood normal.        Behavior: Behavior normal.     UC Treatments / Results  Labs (all labs ordered are listed, but only abnormal results are displayed) Labs Reviewed  POCT INFLUENZA A/B - Abnormal; Notable for the following components:      Result Value   Influenza A, POC Positive (*)    All other components within normal limits  CULTURE, GROUP A STREP [facility])  POCT RAPID STREP A (OFFICE)    EKG   Radiology No results found.  Procedures Procedures (including critical care time)  Medications Ordered in UC Medications - No data to display  Initial Impression / Assessment and Plan / UC Course  I have reviewed the triage vital signs and the nursing notes.  Pertinent labs & imaging results that were available during my care of the patient were reviewed by me and considered in my medical decision making (see chart for details).     Will treat flu A with Tamiflu x5 days.  Rapid strep test was negative.  Throat culture is pending.  Discussed supportive care and symptom management with patient.  Fever monitoring and management discussed with patient.  Discussed return precautions.  Patient verbalized understanding and was agreeable with plan. Final Clinical Impressions(s) / UC Diagnoses   Final diagnoses:  Sore throat  Influenza A     Discharge Instructions      You have tested positive for flu A.  This is being treated with Tamiflu.  Please pick this up from your pharmacy.  You may continue over-the-counter medications as well if needed.     ED Prescriptions  Medication Sig Dispense Auth. Provider   oseltamivir (TAMIFLU) 75 MG capsule Take 1 capsule (75 mg total) by mouth every 12 (twelve) hours. 10 capsule Gustavus Bryant, [place]      PDMP not reviewed this encounter.   Gustavus Bryant, [place] 12/01/20 1635

## 2020-12-01 NOTE — Discharge Instructions (Signed)
You have tested positive for flu A.  This is being treated with Tamiflu.  Please pick this up from your pharmacy.  You may continue over-the-counter medications as well if needed.

## 2020-12-01 NOTE — ED Triage Notes (Signed)
Sore throat, nasal congestion, fever, headache, bilateral ear pain starting Monday. Tylenol used to reduce fever, helped mildly with pain. Took no meds prior to arrival today. Reports nausea, problems with swallowing, and emesis.

## 2020-12-04 LAB — CULTURE, GROUP A STREP (THRC)

## 2020-12-16 IMAGING — DX DG HAND COMPLETE 3+V*R*
3 series · 3 of 3 positions shown · non-contrast
Comparison: None.

CLINICAL DATA: Right hand pain. Hit a post and tree with fist.

EXAM:
RIGHT HAND - COMPLETE 3+ VIEW

[hand pa]
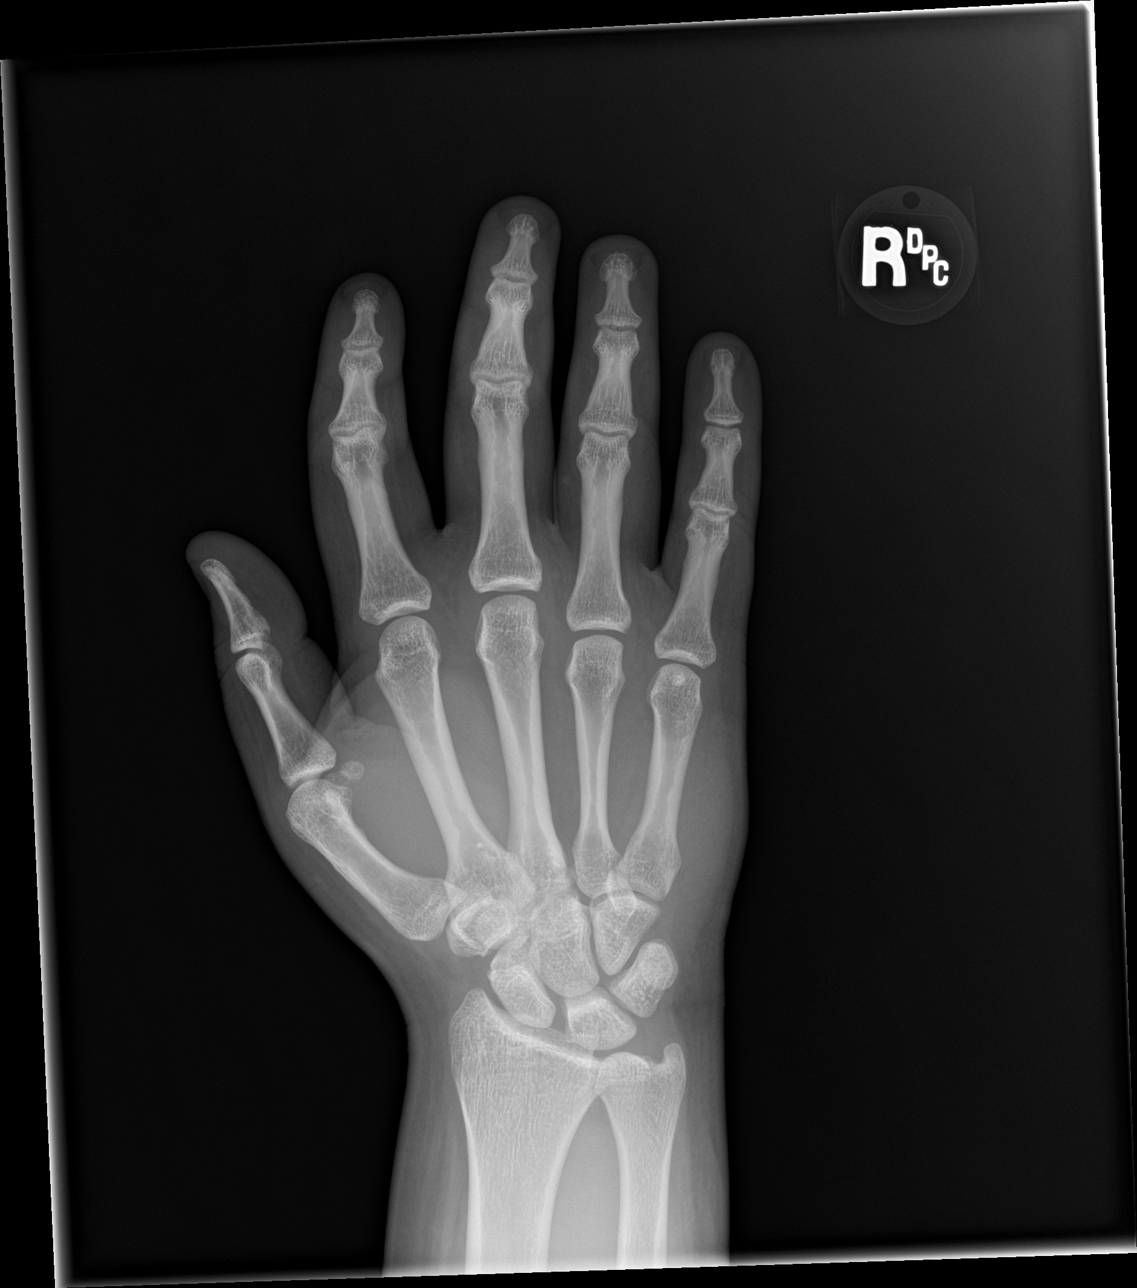

[hand obl]
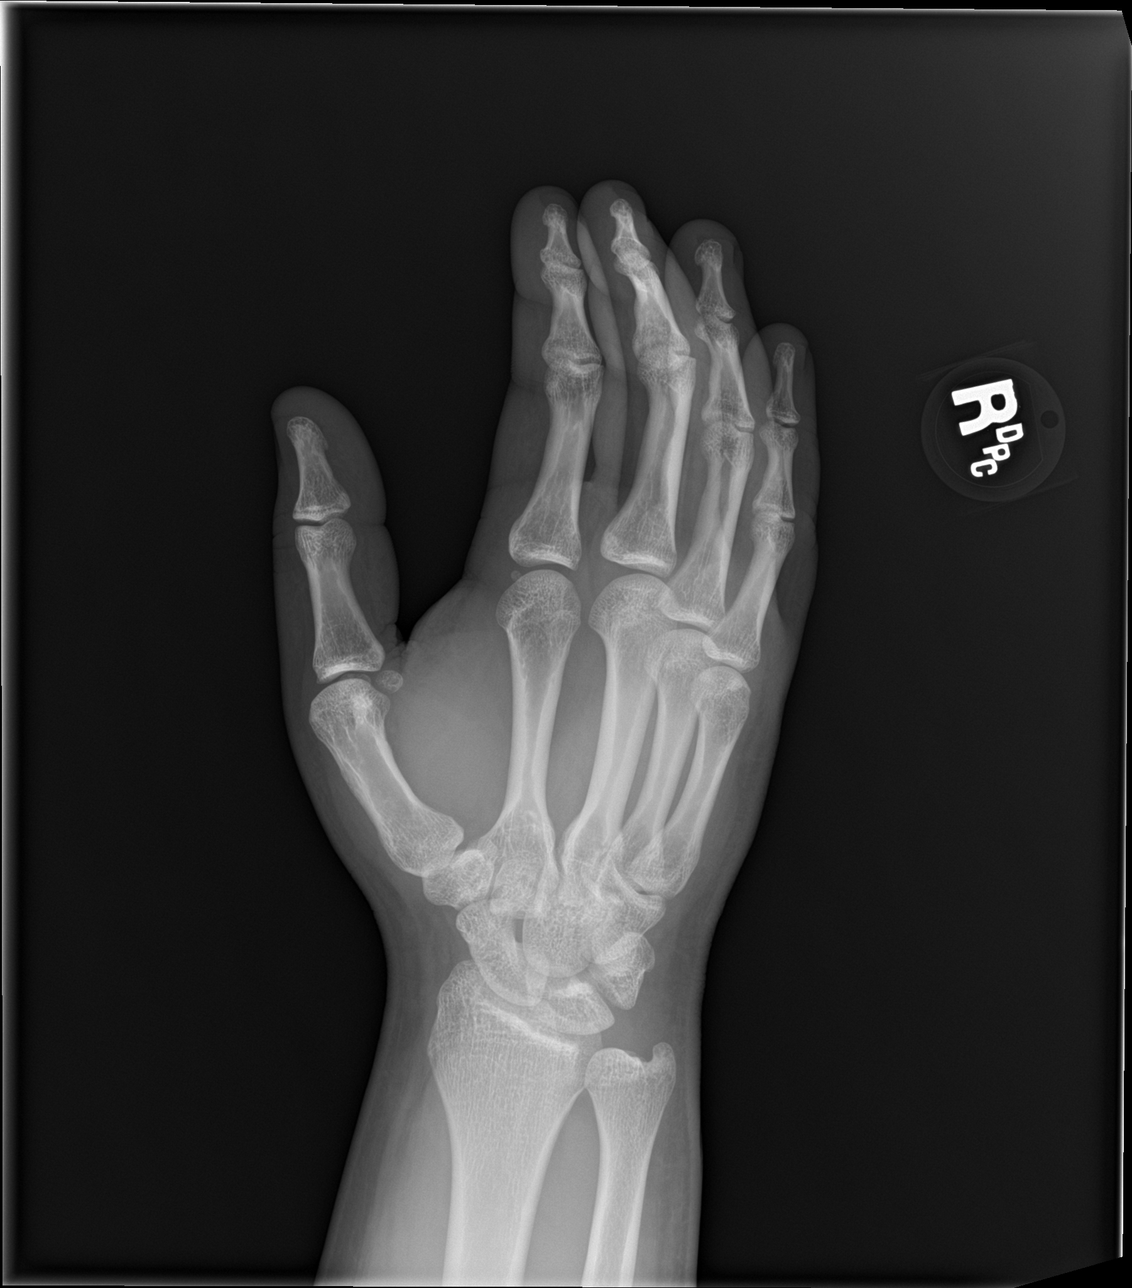

[hand lat]
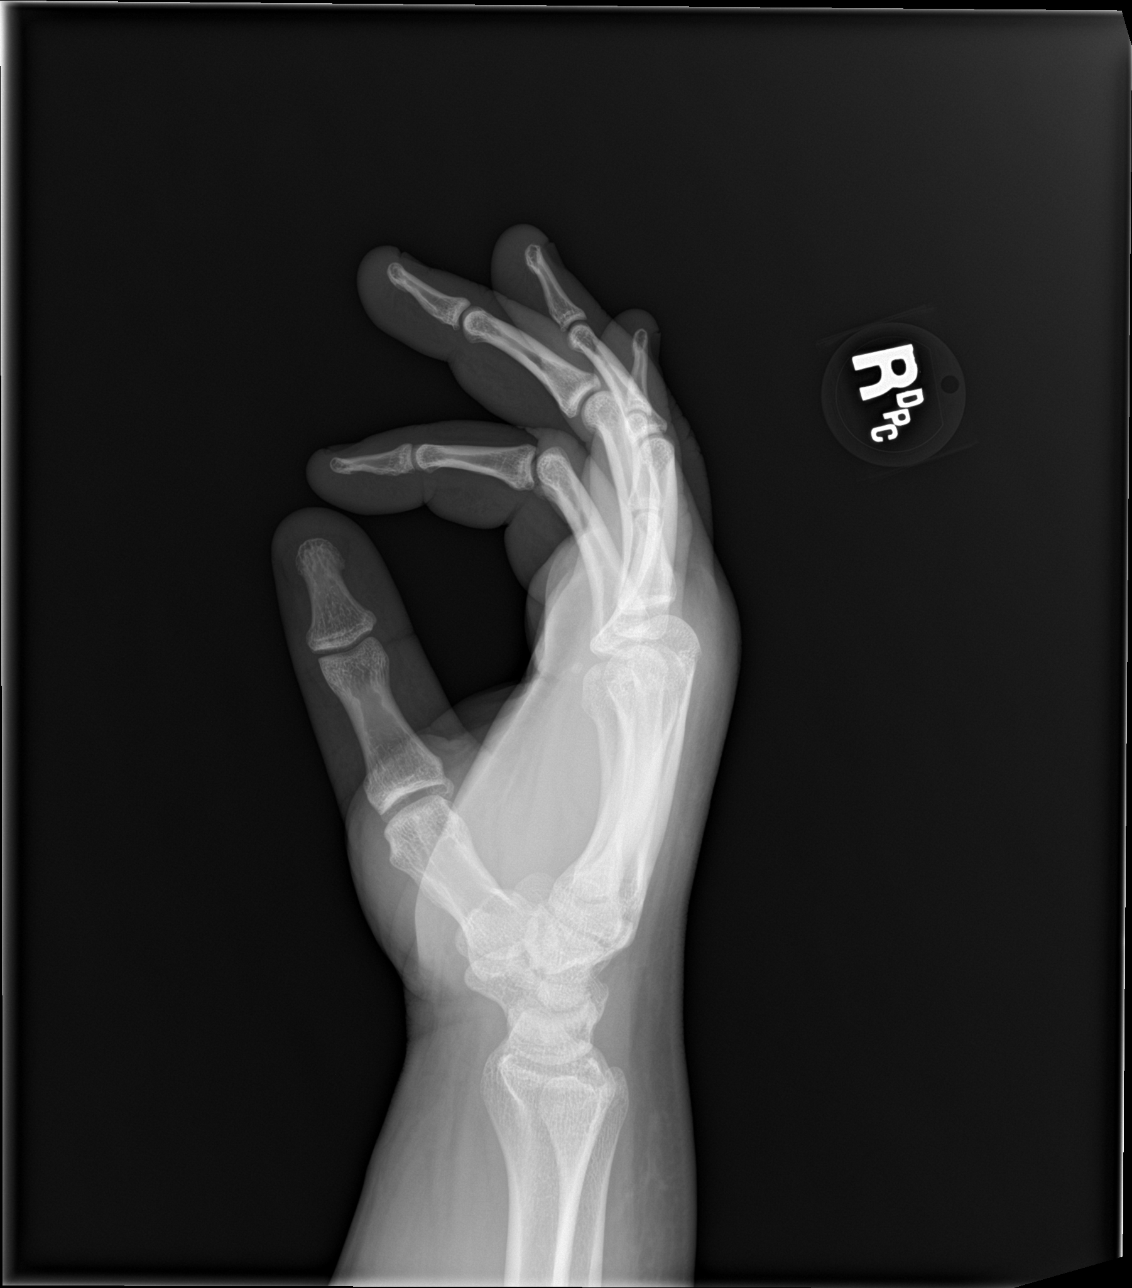

[3 of 3 positions shown; findings below may reference images not displayed]

FINDINGS: There is no evidence of fracture or dislocation. There is no
evidence of arthropathy or other focal bone abnormality. Prominent
soft tissue edema over the dorsum of the hand.
IMPRESSION: Prominent soft tissue edema over the dorsum of the hand. No acute
fracture or osseous abnormality.

## 2021-04-21 DIAGNOSIS — S29019A Strain of muscle and tendon of unspecified wall of thorax, initial encounter: Secondary | ICD-10-CM | POA: Diagnosis not present

## 2021-04-21 DIAGNOSIS — Y93E2 Activity, laundry: Secondary | ICD-10-CM | POA: Insufficient documentation

## 2021-04-21 DIAGNOSIS — X500XXA Overexertion from strenuous movement or load, initial encounter: Secondary | ICD-10-CM | POA: Diagnosis not present

## 2021-04-21 DIAGNOSIS — M546 Pain in thoracic spine: Secondary | ICD-10-CM | POA: Diagnosis present

## 2021-04-21 DIAGNOSIS — R200 Anesthesia of skin: Secondary | ICD-10-CM | POA: Diagnosis not present

## 2021-04-22 ENCOUNTER — Other Ambulatory Visit: Payer: Self-pay

## 2021-04-22 ENCOUNTER — Emergency Department (HOSPITAL_COMMUNITY)
Admission: EM | Admit: 2021-04-22 | Discharge: 2021-04-22 | Disposition: A | Discharge status: Home / Self Care | Payer: Medicaid Other | Attending: Emergency Medicine | Admitting: Emergency Medicine

## 2021-04-22 ENCOUNTER — Encounter (HOSPITAL_COMMUNITY): Payer: Self-pay

## 2021-04-22 DIAGNOSIS — S29019A Strain of muscle and tendon of unspecified wall of thorax, initial encounter: Secondary | ICD-10-CM

## 2021-04-22 MED ORDER — CYCLOBENZAPRINE HCL 10 MG PO TABS
10.0000 mg | ORAL_TABLET | Freq: Once | ORAL | Status: AC
  Administered 2021-04-22: 10 mg via ORAL
  Filled 2021-04-22: qty 1

## 2021-04-22 MED ORDER — MELOXICAM 15 MG PO TABS: 15.0000 mg | ORAL_TABLET | Freq: Every day | ORAL | 0 refills | Status: DC

## 2021-04-22 MED ORDER — ORPHENADRINE CITRATE ER 100 MG PO TB12: 100.0000 mg | ORAL_TABLET | Freq: Two times a day (BID) | ORAL | 0 refills | Status: DC

## 2021-04-22 MED ORDER — IBUPROFEN 800 MG PO TABS
800.0000 mg | ORAL_TABLET | Freq: Once | ORAL | Status: AC
  Administered 2021-04-22: 800 mg via ORAL
  Filled 2021-04-22: qty 1

## 2021-04-22 NOTE — ED Provider Notes (Signed)
?[place] EMERGENCY DEPARTMENT ?Provider Note ? ? ?CSN: [id number] ?Arrival date & time: 04/21/21  2316 ? ?  ? ?History ? ?Chief Complaint  ?Patient presents with  ? Back Pain  ? ? ?Gene Berry is a 21 y.o. male. ? ?The history is provided by the patient.  ?Back Pain ?He has history of bipolar disorder, attention deficit disorder and comes in complaining of pain in his upper back for the last 4 days.  5 days ago, he had done some heavy lifting.  Since then, he has had pain which is mainly in the upper back but does radiate down into the lower back and into the right arm.  Pain is all on the right side.  He has noted some numbness in his right hand but no weakness.  He denies any bowel or bladder dysfunction.  He did additional heavy lifting yesterday, and that seems to have made it worse.  He has taken over-the-counter acetaminophen and ibuprofen without relief. ?  ?Home Medications ?Prior to Admission medications   ?Medication Sig Start Date End Date Taking? Authorizing Provider  ?meloxicam (MOBIC) 15 MG tablet Take 1 tablet (15 mg total) by mouth daily. 04/22/21  Yes Dione Booze, MD  ?orphenadrine (NORFLEX) 100 MG tablet Take 1 tablet (100 mg total) by mouth 2 (two) times daily. 04/22/21  Yes Dione Booze, MD  ?metoprolol tartrate (LOPRESSOR) 25 MG tablet Take 0.5 tablets (12.5 mg total) by mouth 2 (two) times daily. 10/29/20   Netta Neat., NP  ?oseltamivir (TAMIFLU) 75 MG capsule Take 1 capsule (75 mg total) by mouth every 12 (twelve) hours. 12/01/20   Gustavus Bryant, FNP  ?   ? ?Allergies    ?Augmentin [amoxicillin-pot clavulanate]   ? ?Review of Systems   ?Review of Systems  ?Musculoskeletal:  Positive for back pain.  ?All other systems reviewed and are negative. ? ?Physical Exam ?Updated Vital Signs ?BP (!) 103/53   Pulse 63   Temp 98.2 ?F (36.8 ?C) (Oral)   Resp 17   Ht 5\' 9"  (1.753 m)   Wt (!) 139.8 kg   SpO2 97%   BMI 45.51 kg/m?  ?Physical Exam ?Vitals and nursing note reviewed.  ?21 year  old male, resting comfortably and in no acute distress. Vital signs are normal. Oxygen saturation is 97%, which is normal. ?Head is normocephalic and atraumatic. PERRLA, EOMI. Oropharynx is clear. ?Neck is nontender and supple without adenopathy or JVD. ?Back is mildly tender in the right paraspinal thoracic area without point tenderness.  There is no midline tenderness.  There is no CVA tenderness. ?Lungs are clear without rales, wheezes, or rhonchi. ?Chest is nontender. ?Heart has regular rate and rhythm without murmur. ?Abdomen is soft, flat, nontende. ?Extremities have no cyanosis or edema, full range of motion is present. ?Skin is warm and dry without rash. ?Neurologic: Mental status is normal, cranial nerves are intact, strength is 5/5 in all 4 extremities.  Strength testing in the upper extremities included pincer grasp, wrist flexion, wrist extension, elbow flexion, elbow extension, shoulder abduction.  Sensory exam is normal. ? ?ED Results / Procedures / Treatments   ?Labs ?(all labs ordered are listed, but only abnormal results are displayed) ?Labs Reviewed - No data to display ? ?EKG ?None ? ?Radiology ?No results found. ? ?Procedures ?Procedures  ? ? ?Medications Ordered in ED ?Medications  ?ibuprofen (ADVIL) tablet 800 mg (has no administration in time range)  ?cyclobenzaprine (FLEXERIL) tablet 10 mg (has no administration in time  range)  ? ? ?ED Course/ Medical Decision Making/ A&P ?  ?                        ?Medical Decision Making ?Risk ?Prescription drug management. ? ? ?Thoracic myofascial strain.  No evidence of any neurologic injury.  Imaging is not indicated.  He is discharged with prescriptions for meloxicam and orphenadrine, advised to use ice and add acetaminophen as needed. ? ?Final Clinical Impression(s) / ED Diagnoses ?Final diagnoses:  ?Thoracic myofascial strain, initial encounter  ? ? ?Rx / DC Orders ?ED Discharge Orders   ? ?      Ordered  ?  orphenadrine (NORFLEX) 100 MG tablet  2  times daily       ? 04/22/21 0115  ?  meloxicam (MOBIC) 15 MG tablet  Daily       ? 04/22/21 0115  ? ?  ?  ? ?  ? ? ?  ?Dione Booze, MD ?04/22/21 0121 ? ?

## 2021-04-22 NOTE — Discharge Instructions (Signed)
Apply ice to the sore areas.  Ice to be applied for 30 minutes at a time, 4 times a day. ? ?In addition to the prescribed medication, you may take acetaminophen every 6 hours as needed.  Combining acetaminophen with meloxicam will give you better pain relief than you get from taking either medication by itself.  Do not take ibuprofen or naproxen because they will just upset your stomach without giving you additional pain relief. ?

## 2021-04-22 NOTE — ED Triage Notes (Signed)
Pt reports back pain x 3 days after moving/lifting washer, dryer, and couch. Pt says the "pain starts in lower back and moves up spine to neck then shoots down right arm."  Pt ambulatory with steady gait, no abnormalities noted.  ?

## 2021-08-27 ENCOUNTER — Other Ambulatory Visit: Payer: Self-pay

## 2021-08-27 ENCOUNTER — Emergency Department (HOSPITAL_BASED_OUTPATIENT_CLINIC_OR_DEPARTMENT_OTHER)
Admission: EM | Admit: 2021-08-27 | Discharge: 2021-08-27 | Disposition: A | Discharge status: Home / Self Care | Payer: Medicaid Other | Attending: Emergency Medicine | Admitting: Emergency Medicine

## 2021-08-27 ENCOUNTER — Encounter (HOSPITAL_BASED_OUTPATIENT_CLINIC_OR_DEPARTMENT_OTHER): Payer: Self-pay | Admitting: Emergency Medicine

## 2021-08-27 ENCOUNTER — Emergency Department (HOSPITAL_BASED_OUTPATIENT_CLINIC_OR_DEPARTMENT_OTHER): Payer: Medicaid Other | Admitting: Radiology

## 2021-08-27 DIAGNOSIS — R0789 Other chest pain: Secondary | ICD-10-CM | POA: Diagnosis not present

## 2021-08-27 DIAGNOSIS — Z79899 Other long term (current) drug therapy: Secondary | ICD-10-CM | POA: Insufficient documentation

## 2021-08-27 DIAGNOSIS — Z87891 Personal history of nicotine dependence: Secondary | ICD-10-CM | POA: Diagnosis not present

## 2021-08-27 DIAGNOSIS — R079 Chest pain, unspecified: Secondary | ICD-10-CM | POA: Diagnosis present

## 2021-08-27 HISTORY — DX: Supraventricular tachycardia: I47.1

## 2021-08-27 HISTORY — DX: Supraventricular tachycardia, unspecified: I47.10

## 2021-08-27 LAB — BASIC METABOLIC PANEL
Anion gap: 11 (ref 5–15)
BUN: 13 mg/dL (ref 6–20)
CO2: 21 mmol/L — ABNORMAL LOW (ref 22–32)
Calcium: 9.5 mg/dL (ref 8.9–10.3)
Chloride: 107 mmol/L (ref 98–111)
Creatinine, Ser: 1.01 mg/dL (ref 0.61–1.24)
GFR, Estimated: >60 mL/min (ref >60)
Glucose, Bld: 96 mg/dL (ref 70–99)
Potassium: 3.9 mmol/L (ref 3.5–5.1)
Sodium: 139 mmol/L (ref 135–145)

## 2021-08-27 LAB — CBC
HCT: 48.6 % (ref 39.0–52.0)
Hemoglobin: 16.6 g/dL (ref 13.0–17.0)
MCH: 29.3 pg (ref 26.0–34.0)
MCHC: 34.2 g/dL (ref 30.0–36.0)
MCV: 85.9 fL (ref 80.0–100.0)
Platelets: 303 10*3/uL (ref 150–400)
RBC: 5.66 MIL/uL (ref 4.22–5.81)
RDW: 12.4 % (ref 11.5–15.5)
WBC: 8.5 10*3/uL (ref 4.0–10.5)
nRBC: 0 % (ref 0.0–0.2)

## 2021-08-27 LAB — TROPONIN I (HIGH SENSITIVITY): Troponin I (High Sensitivity): 3 ng/L (ref <18)

## 2021-08-27 MED ORDER — NAPROXEN 250 MG PO TABS
500.0000 mg | ORAL_TABLET | Freq: Once | ORAL | Status: AC
  Administered 2021-08-27: 500 mg via ORAL
  Filled 2021-08-27: qty 2

## 2021-08-27 MED ORDER — NAPROXEN 375 MG PO TABS: ORAL_TABLET | ORAL | 0 refills | Status: DC

## 2021-08-27 NOTE — ED Provider Notes (Signed)
[facility] Provider Note: Gene Dell, MD, FACEP  CSN: [id number] MRN: [id number] ARRIVAL: 08/27/21 at 1803 ROOM: DB002/DB002   CHIEF COMPLAINT  Chest Pain   HISTORY OF PRESENT ILLNESS  08/27/21 11:15 PM Gene Berry is a 21 y.o. male with 1 week of chest pain.  The chest pain is located in his left upper chest and axillary region.  He rates it as a 7 out of 10, sharp in nature.  It is worse with palpation or with deep breathing.  He did have 1 episode of near syncope when he was moving his bowels yesterday but otherwise has not had any lightheadedness or difficulty breathing.    Past Medical History:  Diagnosis Date   ADHD    Bipolar affective disorder (HCC)    SVT (supraventricular tachycardia) (HCC)     Past Surgical History:  Procedure Laterality Date   APPENDECTOMY      Family History  Problem Relation Age of Onset   Anxiety disorder Mother    Hypertension Father    Cancer - Other Father    Heart attack Maternal Grandmother    Heart attack Maternal Grandfather    Cirrhosis Maternal Grandfather    Stroke Maternal Grandfather    Heart attack Paternal Grandmother    Heart attack Paternal Grandfather     Social History   Tobacco Use   Smoking status: Former    Packs/day: 2.00    Years: 4.00    Total pack years: 8.00    Types: Cigarettes    Quit date: 10/27/2019    Years since quitting: 1.8   Smokeless tobacco: Former    Types: Chew   Tobacco comments:    smokes 5-6 cigarettes per day, vapes all other times-01/29/2020  Vaping Use   Vaping Use: Every day  Substance Use Topics   Alcohol use: Not Currently    Prior to Admission medications   Medication Sig Start Date End Date Taking? Authorizing Provider  naproxen (NAPROSYN) 375 MG tablet Take 1 tablet twice daily as needed for chest wall pain. 08/27/21  Yes Dymir Neeson, MD  metoprolol tartrate (LOPRESSOR) 25 MG tablet Take 0.5 tablets (12.5 mg total) by mouth 2 (two) times daily. 10/29/20   Netta Neat., NP    Allergies Augmentin [amoxicillin-pot clavulanate]   REVIEW OF SYSTEMS  Negative except as noted here or in the History of Present Illness.   PHYSICAL EXAMINATION  Initial Vital Signs Blood pressure 107/68, pulse 65, temperature 99 F (37.2 C), temperature source Oral, resp. rate 18, height 5\' 9"  (1.753 m), weight (!) 142.9 kg, SpO2 97 %.  Examination General: Well-developed, high BMI male in no acute distress; appearance consistent with age of record HENT: normocephalic; atraumatic Eyes: Normal appearance Neck: supple Heart: regular rate and rhythm Lungs: clear to auscultation bilaterally Chest: Left upper and lateral chest wall tenderness without deformity or crepitus Abdomen: soft; nondistended; nontender; bowel sounds present Extremities: No deformity; full range of motion; pulses normal Neurologic: Awake, alert and oriented; motor function intact in all extremities and symmetric; no facial droop Skin: Warm and dry Psychiatric: Normal mood and affect   RESULTS  Summary of this visit's results, reviewed and interpreted by myself:   EKG Interpretation  Date/Time:  Saturday August 27 2021 18:26:08 EDT Ventricular Rate:  88 PR Interval:  138 QRS Duration: 88 QT Interval:  346 QTC Calculation: 418 R Axis:   40 Text Interpretation: Normal sinus rhythm with sinus arrhythmia When compared with ECG of  05-Aug-2019 19:11, No significant change was found Confirmed by Paula Libra ([id number]) on 08/27/2021 11:15:42 PM       Laboratory Studies: Results for orders placed or performed during the hospital encounter of 08/27/21 (from the past 24 hour(s))  Basic metabolic panel     Status: Abnormal   Collection Time: 08/27/21  6:44 PM  Result Value Ref Range   Sodium 139 135 - 145 mmol/L   Potassium 3.9 3.5 - 5.1 mmol/L   Chloride 107 98 - 111 mmol/L   CO2 21 (L) 22 - 32 mmol/L   Glucose, Bld 96 70 - 99 mg/dL   BUN 13 6 - 20 mg/dL   Creatinine, Ser 2.70 0.61 -  1.24 mg/dL   Calcium 9.5 8.9 - 35.0 mg/dL   GFR, Estimated >09 >38 mL/min   Anion gap 11 5 - 15  CBC     Status: None   Collection Time: 08/27/21  6:44 PM  Result Value Ref Range   WBC 8.5 4.0 - 10.5 K/uL   RBC 5.66 4.22 - 5.81 MIL/uL   Hemoglobin 16.6 13.0 - 17.0 g/dL   HCT 18.2 99.3 - 71.6 %   MCV 85.9 80.0 - 100.0 fL   MCH 29.3 26.0 - 34.0 pg   MCHC 34.2 30.0 - 36.0 g/dL   RDW 96.7 89.3 - 81.0 %   Platelets 303 150 - 400 K/uL   nRBC 0.0 0.0 - 0.2 %  Troponin I (High Sensitivity)     Status: None   Collection Time: 08/27/21  6:44 PM  Result Value Ref Range   Troponin I (High Sensitivity) 3 <18 ng/L   Imaging Studies: DG Chest 2 View  Result Date: 08/27/2021 CLINICAL DATA:  Chest pain EXAM: CHEST - 2 VIEW COMPARISON:  08/05/2019 FINDINGS: The heart size and mediastinal contours are within normal limits. Both lungs are clear. The visualized skeletal structures are unremarkable. IMPRESSION: Negative. Electronically Signed   By: Charlett Nose M.D.   On: 08/27/2021 18:46    ED COURSE and MDM  Nursing notes, initial and subsequent vitals signs, including pulse oximetry, reviewed and interpreted by myself.  Vitals:   08/27/21 2035 08/27/21 2130 08/27/21 2230 08/27/21 2304  BP: 116/71 113/80 107/68   Pulse: 74 62 65   Resp: 17 11 18    Temp:    99 F (37.2 C)  TempSrc:    Oral  SpO2: 96% 99% 97%   Weight:      Height:       Medications  naproxen (NAPROSYN) tablet 500 mg (has no administration in time range)    The patient's EKG and troponin are within normal limits.  His chest x-ray is unremarkable.  On physical exam his pain is readily reproducible and is not in a pattern suggestive of cardiac etiology.  I believe this represents chest wall pain and I do not believe any additional work-up is required.  He does have a cardiologist with whom he can follow-up.  PROCEDURES  Procedures   ED DIAGNOSES     ICD-10-CM   1. Chest wall pain  R07.89          04-02-1988,  MD 08/27/21 2325

## 2021-08-27 NOTE — ED Notes (Signed)
Patient verbalizes understanding of discharge instructions. Opportunity for questioning and answers were provided. Armband removed by staff, pt discharged from ED. Ambulated out to lobby  

## 2021-08-27 NOTE — ED Triage Notes (Signed)
Pt via pov from home with cp x 1 week. Pt states it has continued for the week. He reports that he stood up yesterday and felt lightheaded and his chest hurt worse. Pt then went to bed and came in today. Pt alert & oriented, nad noted.

## 2021-09-28 IMAGING — DX DG CHEST 2V
2 series · 2 of 2 positions shown · non-contrast
Comparison: None.

CLINICAL DATA: 18-year-old male with chest pain.

EXAM:
CHEST - 2 VIEW

[chest pa]
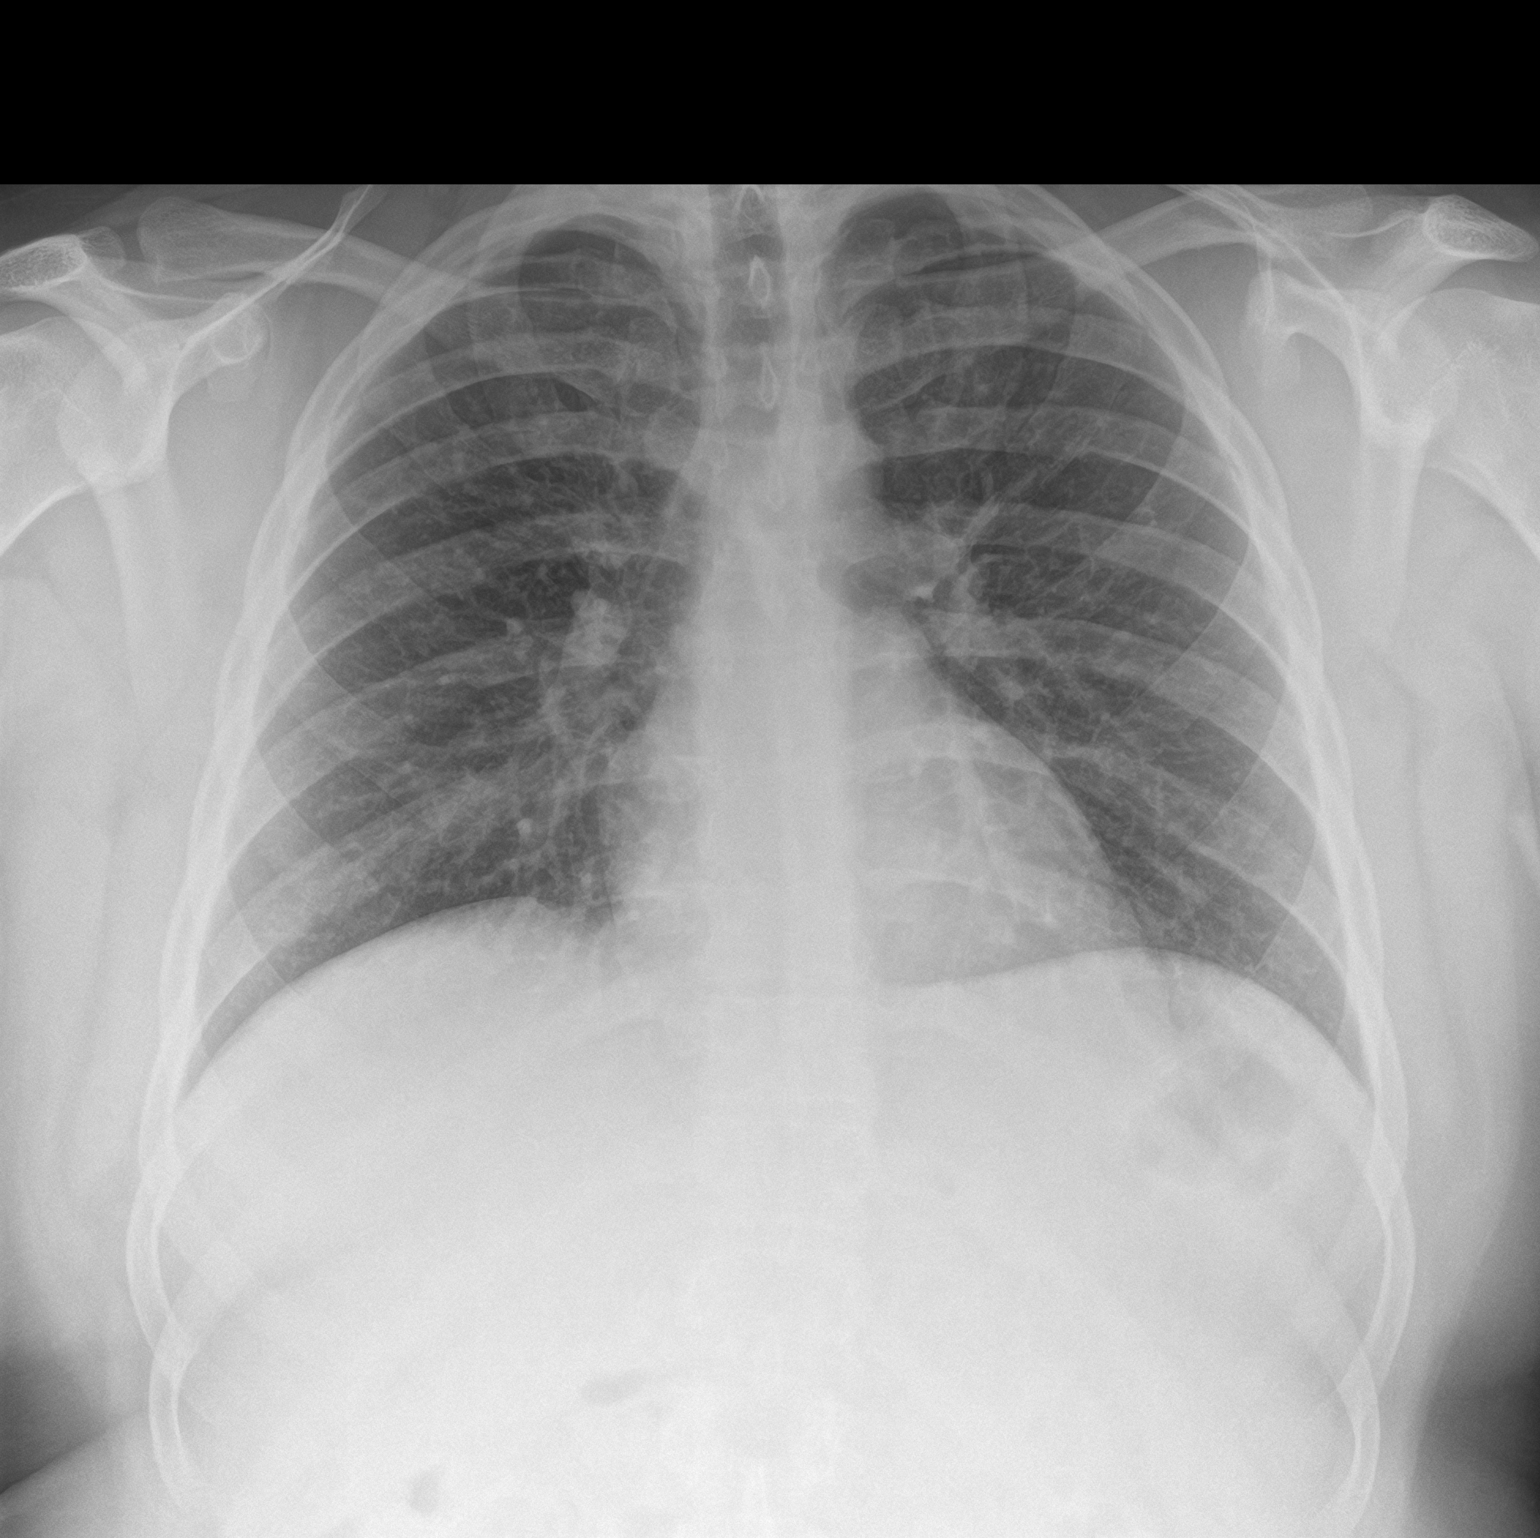

[chest lat]
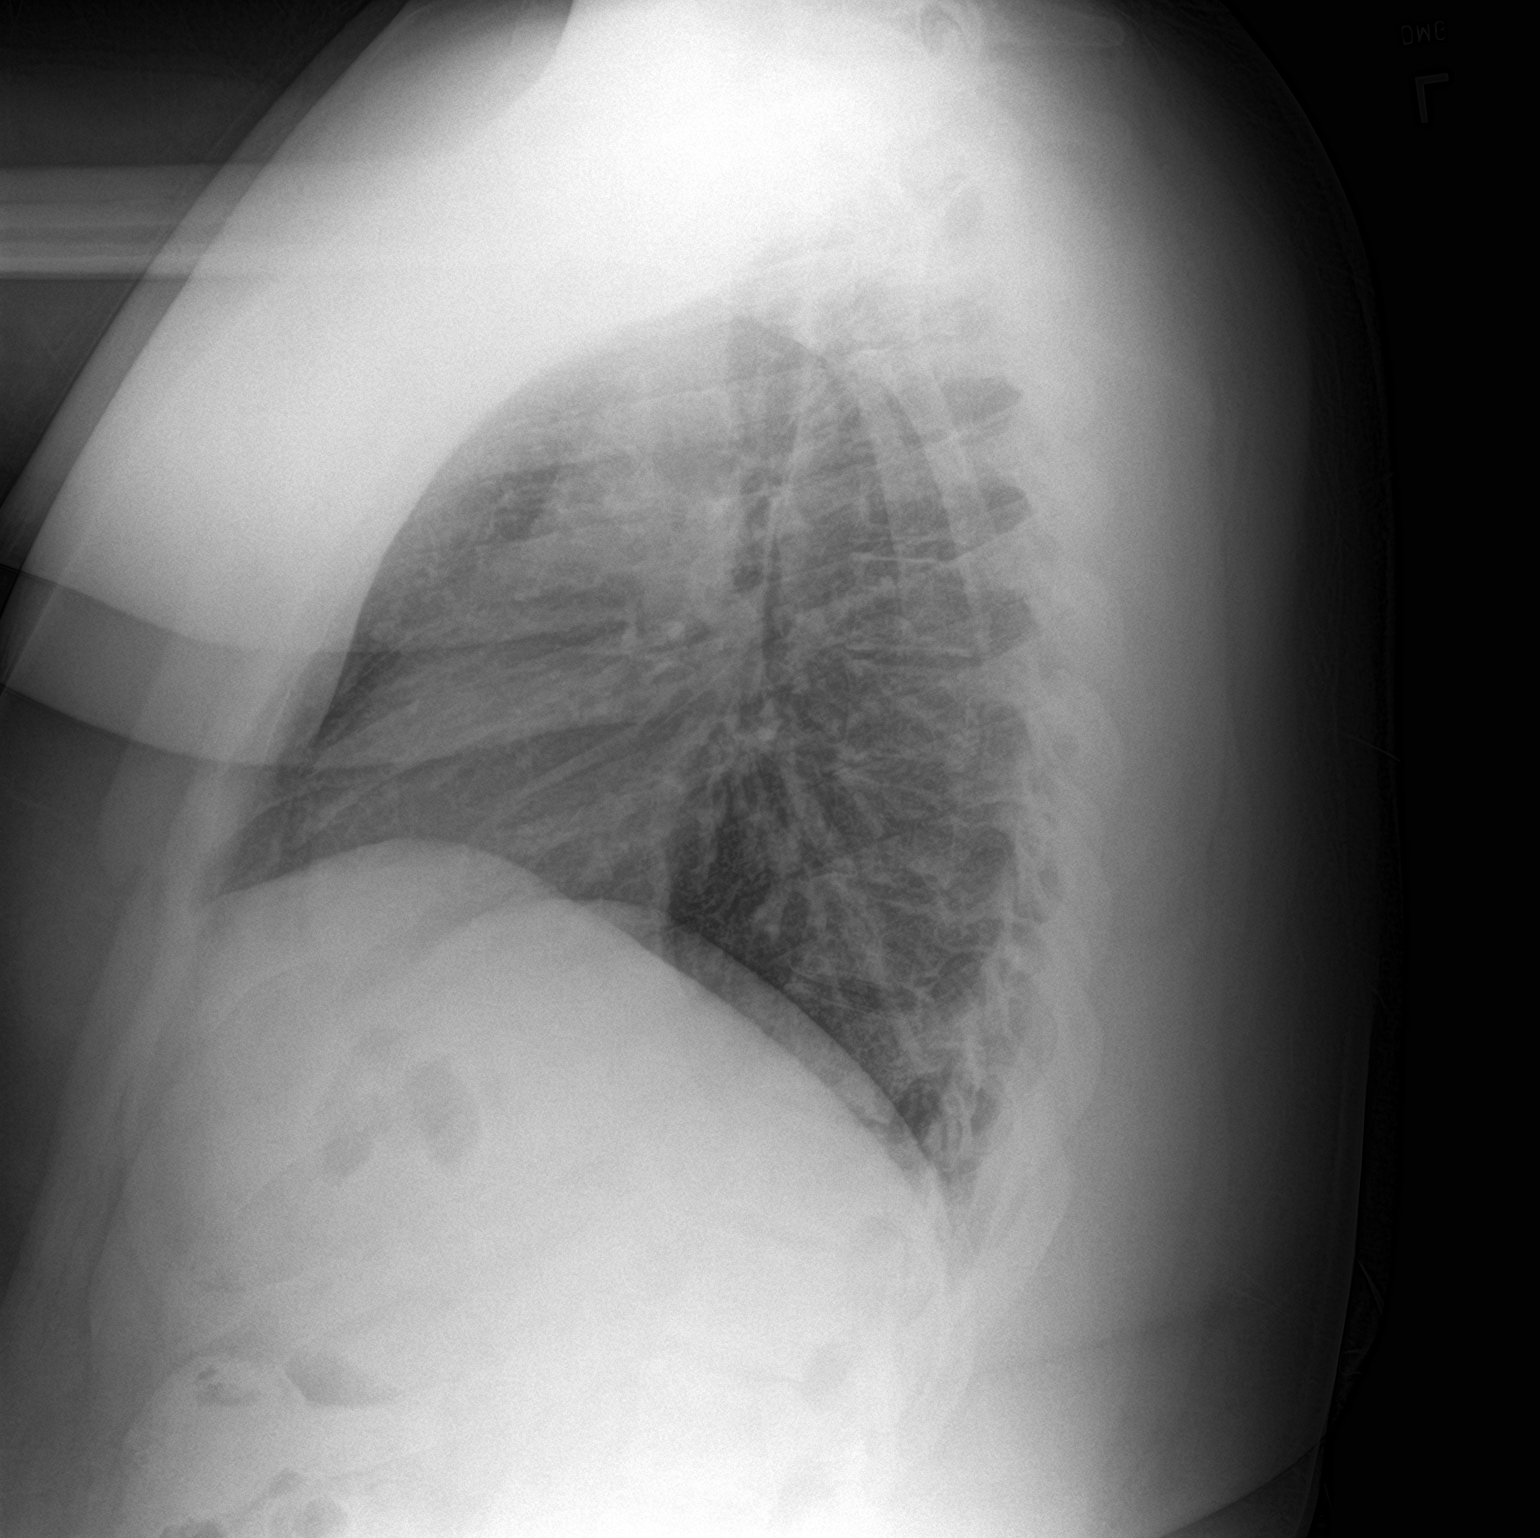

[2 of 2 positions shown; findings below may reference images not displayed]

FINDINGS: The heart size and mediastinal contours are within normal limits.
Both lungs are clear. The visualized skeletal structures are
unremarkable.
IMPRESSION: No active cardiopulmonary disease.

## 2021-10-31 ENCOUNTER — Ambulatory Visit: Payer: Medicaid Other | Admitting: Nurse Practitioner

## 2021-11-21 ENCOUNTER — Ambulatory Visit: Payer: Medicaid Other | Attending: Internal Medicine | Admitting: Internal Medicine

## 2021-11-21 ENCOUNTER — Ambulatory Visit: Payer: Medicaid Other | Admitting: Internal Medicine

## 2021-11-21 ENCOUNTER — Encounter: Payer: Self-pay | Admitting: Internal Medicine

## 2021-11-21 VITALS — BP 132/74 | HR 94 | Ht 69.0 in | Wt 298.4 lb

## 2021-11-21 DIAGNOSIS — R079 Chest pain, unspecified: Secondary | ICD-10-CM | POA: Diagnosis not present

## 2021-11-21 DIAGNOSIS — R0789 Other chest pain: Secondary | ICD-10-CM | POA: Insufficient documentation

## 2021-11-21 MED ORDER — METOPROLOL TARTRATE 100 MG PO TABS: 100.0000 mg | ORAL_TABLET | Freq: Once | ORAL | 0 refills | Status: DC

## 2021-11-21 NOTE — Patient Instructions (Addendum)
Medication Instructions:  Your physician recommends that you continue on your current medications as directed. Please refer to the Current Medication list given to you today.  Labwork: none  Testing/Procedures: Coronary CTA  Follow-Up: Your physician recommends that you schedule a follow-up appointment in: 6-8 weeks  Any Other Special Instructions Will Be Listed Below (If Applicable).  If you need a refill on your cardiac medications before your next appointment, please call your pharmacy.    Your cardiac CT will be scheduled at one of the below locations:   [facility] [address] [place], [place] [address] [phone]  If scheduled at [facility], please arrive at the [facility] and Children's Entrance (Entrance C2) of [facility] 30 minutes prior to test start time. You can use the FREE valet parking offered at entrance C (encouraged to control the heart rate for the test)  Proceed to the [facility] Radiology Department (first floor) to check-in and test prep.  All radiology patients and guests should use entrance C2 at [facility], accessed from [facility], even though the hospital's physical address listed is [address]     Please follow these instructions carefully (unless otherwise directed):  Hold all erectile dysfunction medications at least 3 days (72 hrs) prior to test. (Ie viagra, cialis, sildenafil, tadalafil, etc) We will administer nitroglycerin during this exam.   On the Night Before the Test: Be sure to Drink plenty of water. Do not consume any caffeinated/decaffeinated beverages or chocolate 12 hours prior to your test. Do not take any antihistamines 12 hours prior to your test. If the patient has contrast allergy: no allergy  On the Day of the Test: Drink plenty of water until 1 hour prior to the test. Do not eat any food 1 hour prior to test. You may take your regular medications prior  to the test.  Take metoprolol (Lopressor) 100 mg two hours prior to test.      After the Test: Drink plenty of water. After receiving IV contrast, you may experience a mild flushed feeling. This is normal. On occasion, you may experience a mild rash up to 24 hours after the test. This is not dangerous. If this occurs, you can take Benadryl 25 mg and increase your fluid intake. If you experience trouble breathing, this can be serious. If it is severe call 911 IMMEDIATELY. If it is mild, please call our office.  We will call to schedule your test 2-4 weeks out understanding that some insurance companies will need an authorization prior to the service being performed.   For non-scheduling related questions, please contact the cardiac imaging nurse navigator should you have any questions/concerns: Marchia Bond, Cardiac Imaging Nurse Navigator Gordy Clement, Cardiac Imaging Nurse Navigator [facility] Direct Office Dial: [phone]   For scheduling needs, including cancellations and rescheduling, please call [place], [phone].

## 2021-11-21 NOTE — Progress Notes (Signed)
Cardiology Office Note  Date: 11/21/2021   ID: Gene Berry, DOB [birth year], MRN [id number]  PCP:  Patient, No Pcp Per  Cardiologist:  Chalmers Guest, MD Electrophysiologist:  None   Reason for Office Visit: Follow-up of chest pain   History of Present Illness: Gene Berry is a 21 y.o. male known to have ADHD presented to the cardiology clinic for follow-up visit.  He was initially referred to cardiology clinic for evaluation of chest pain/palpitations for which nuclear stress test was performed in 2022 which was normal and also an event monitor in 2022 which showed no atrial or ventricular arrhythmias. He subsequently had recurrent admissions to the ER with chest pain and back pain.  Patient is here today for follow-up visit.  He states that he is getting used to the chest pains at rest and exertion, sometimes gets worse with inspiration, lasting 5 to 15 minutes and is resolved with rest. He is not taking metoprolol for the last 1 year as it is making his chest pain worse.  Denied any DOE, dizziness, lightness, syncope, LE swelling.  He is worried that he has a strong family history of heart disease in his family, his maternal and paternal side had PCI/CABG in their 52s. He vapes, denied alcohol use or illicit drug abuse.  Past Medical History:  Diagnosis Date   ADHD    Bipolar affective disorder ([place])    SVT (supraventricular tachycardia)     Past Surgical History:  Procedure Laterality Date   APPENDECTOMY      Current Outpatient Medications  Medication Sig Dispense Refill   metoprolol tartrate (LOPRESSOR) 25 MG tablet Take 0.5 tablets (12.5 mg total) by mouth 2 (two) times daily. (Patient not taking: Reported on 11/21/2021) 30 tablet 6   naproxen (NAPROSYN) 375 MG tablet Take 1 tablet twice daily as needed for chest wall pain. (Patient not taking: Reported on 11/21/2021) 20 tablet 0   No current facility-administered medications for this visit.   Allergies:   Augmentin [amoxicillin-pot clavulanate]   Social History: The patient  reports that he quit smoking about 2 years ago. His smoking use included cigarettes. He has a 8.00 pack-year smoking history. He has never been exposed to tobacco smoke. He has quit using smokeless tobacco.  His smokeless tobacco use included chew. He reports that he does not currently use alcohol.  Drug: Marijuana.   Family History: The patient's family history includes Anxiety disorder in his mother; Cancer - Other in his father; Cirrhosis in his maternal grandfather; Heart attack in his maternal grandfather, maternal grandmother, paternal grandfather, and paternal grandmother; Hypertension in his father; Stroke in his maternal grandfather.   ROS:  Please see the history of present illness. Otherwise, complete review of systems is positive for none.  All other systems are reviewed and negative.   Physical Exam: VS:  BP 132/74 (BP Location: Left Arm, Patient Position: Sitting, Cuff Size: Large)   Pulse 94   Ht 5\' 9"  (1.753 m)   Wt 298 lb 6.4 oz (135.4 kg)   SpO2 97%   BMI 44.07 kg/m , BMI Body mass index is 44.07 kg/m.  Wt Readings from Last 3 Encounters:  11/21/21 298 lb 6.4 oz (135.4 kg)  08/27/21 (!) 315 lb (142.9 kg)  04/22/21 (!) 308 lb 3.3 oz (139.8 kg)    General: Patient appears comfortable at rest. HEENT: Conjunctiva and lids normal, oropharynx clear with moist mucosa. Neck: Supple, no elevated JVP or carotid bruits, no thyromegaly. Lungs: Clear  to auscultation, nonlabored breathing at rest. Cardiac: Regular rate and rhythm, no S3 or significant systolic murmur, no pericardial rub. Abdomen: Soft, nontender, no hepatomegaly, bowel sounds present, no guarding or rebound. Extremities: No pitting edema, distal pulses 2+. Skin: Warm and dry. Musculoskeletal: No kyphosis. Neuropsychiatric: Alert and oriented x3, affect grossly appropriate.  ECG:  An ECG dated 08/27/2021 was personally reviewed today and  demonstrated:  NSR  Recent Labwork: 08/27/2021: BUN 13; Creatinine, Ser 1.01; Hemoglobin 16.6; Platelets 303; Potassium 3.9; Sodium 139  No results found for: "CHOL", "TRIG", "HDL", "CHOLHDL", "VLDL", "LDLCALC", "LDLDIRECT"  Other Studies Reviewed Today: Echo in September 2021  1. Left ventricular ejection fraction, by estimation, is 55 to 60%. The  left ventricle has normal function. Left ventricular endocardial border  not optimally defined to evaluate regional wall motion. Left ventricular  diastolic parameters were normal.   2. Right ventricular systolic function is normal. The right ventricular  size is normal. Tricuspid regurgitation signal is inadequate for assessing  PA pressure.   3. The mitral valve is grossly normal. Trivial mitral valve  regurgitation.   4. The aortic valve is tricuspid. Aortic valve regurgitation is not  visualized.   5. The inferior vena cava is normal in size with greater than 50%  respiratory variability, suggesting right atrial pressure of 3 mmHg.   NM stress test in 2022 There was no ST segment deviation noted during stress. The study is normal. There are no perfusion defects consistent with prior infarct or current ischemia. This is a low risk study. The left ventricular ejection fraction is normal (55-65%).  Long-term monitoring 2022 11 day monitor Rare supraventricular ectopy in the form of isolated PACs No ventricular ectopy Symptoms correlated with sinus rhythm and rare PACs. No significant arrhythmias  Assessment and Plan: Patient is a 21 year old M known to have ADHD who presented to the cardiology clinic for follow-up of chest pain.  #Atypical chest pain Plan -Patient had nuclear stress test in 2022 which showed no evidence of ischemia. We will obtain CTA cardiac to rule out any anomalous coronary artery origin. If CT cardiac is normal, patient will be discharged from the cardiology clinic.  If CTA cardiac is abnormal, he will be  referred to CT surgery.  I have spent a total of 22 minutes with patient reviewing chart, EKGs, labs and examining patient as well as establishing an assessment and plan that was discussed with the patient.  > 50% of time was spent in direct patient care.     Medication Adjustments/Labs and Tests Ordered: Current medicines are reviewed at length with the patient today.  Concerns regarding medicines are outlined above.   Tests Ordered: No orders of the defined types were placed in this encounter.   Medication Changes: No orders of the defined types were placed in this encounter.   Disposition:  Follow up  1.5-2 month  Signed Juanitta Earnhardt Fidel Levy, MD, 11/21/2021 4:11 PM    [facility], [place], [place] [address]

## 2021-11-23 ENCOUNTER — Encounter: Payer: Self-pay | Admitting: Family

## 2021-11-23 ENCOUNTER — Ambulatory Visit: Payer: Medicaid Other | Admitting: Family

## 2021-11-23 VITALS — BP 102/64 | HR 87 | Temp 97.5°F | Ht 69.0 in | Wt 297.5 lb

## 2021-11-23 DIAGNOSIS — F411 Generalized anxiety disorder: Secondary | ICD-10-CM | POA: Diagnosis not present

## 2021-11-23 DIAGNOSIS — M545 Low back pain, unspecified: Secondary | ICD-10-CM | POA: Diagnosis not present

## 2021-11-23 DIAGNOSIS — R053 Chronic cough: Secondary | ICD-10-CM

## 2021-11-23 MED ORDER — METHYLPREDNISOLONE ACETATE 80 MG/ML IJ SUSP
80.0000 mg | Freq: Once | INTRAMUSCULAR | Status: AC
  Administered 2021-11-23: 80 mg via INTRAMUSCULAR

## 2021-11-23 MED ORDER — METHOCARBAMOL 500 MG PO TABS: 500.0000 mg | ORAL_TABLET | Freq: Four times a day (QID) | ORAL | 0 refills | Status: DC

## 2021-11-23 MED ORDER — BENZONATATE 200 MG PO CAPS: 200.0000 mg | ORAL_CAPSULE | Freq: Three times a day (TID) | ORAL | 0 refills | Status: AC | PRN

## 2021-11-23 NOTE — Assessment & Plan Note (Signed)
   high GAD screen score  reports having anxiety at different times  advised pt on meds and therapy  explained that I do not prescribe Xanax as first line d/t addictive potential  discussed healthy coping measures including exercise, meditation, working on hobby  f/u in 1-2 weeks

## 2021-11-23 NOTE — Patient Instructions (Addendum)
Welcome to [facility], It was a pleasure meeting you today!   For your sinus sx, look for generic Sudafed (at pharmacy counter) and take as directed to help with congestion & drainage which also contribute to your cough. I have sent a medication (tessalon pearles) to help decrease the amount of coughing.  As discussed, I have sent a muscle relaxer  to your pharmacy, Methocarbamol (Robaxin) to take for your back pain. See the handout for other tips on helping your pain. Recommend getting into a weight training program to strengthen the muscles in your back. You have to lose weight to take the pressure off of your spine.  Please schedule a 1-2 week follow up visit today to discuss your Anxiety.    PLEASE NOTE: If you had any LAB tests please let us know if you have not heard back within a few days. You may see your results on MyChart before we have a chance to review them but we will give you a call once they are reviewed by [place]. If we ordered any REFERRALS today, please let us know if you have not heard from their office within the next week.  Let us know through MyChart if you are needing REFILLS, or have your pharmacy send [place] the request. You can also use MyChart to communicate with me or any office staff.

## 2021-11-23 NOTE — Progress Notes (Signed)
New Patient Office Visit  Subjective:  Patient ID: Gene Berry, male    DOB: 2000-07-07  Age: 21 y.o. MRN: [id number]  CC:  Chief Complaint  Patient presents with   Establish Care   Back Pain    Pt c/o lower back pain for about a month.    Anxiety    Discuss medications, Has tried xanax in the past.    Cough    sx for 2 wks   HPI Gene Berry presents for establishing care today.  Persistent cough:  going on 3 weeks, mostly chest congestion, hydrating well, little appetite, worse in mornings. Pt c/o dry cough, nausea and runny nose. Denies fever or diarrhea. Has tried dayquil, mucinex and robitussin which did not help. Pt does vape daily.   Lumbar pain:  pt does physical work, heavy lifting, pt is also morbidly obese. Denies radiculopathy, feels pain right on his spine. Has tried ibuprofen 800 mg, which does not help the pain. Muscle relaxer in the past helped.   Anxiety:  pt is engaged, nervous around lots of people, describes social anxiety. Denies any depression. States he took his fiance's Xanax and it helped.   Assessment & Plan:   Problem List Items Addressed This Visit       Other   Generalized anxiety disorder    high GAD screen score reports having anxiety at different times advised pt on meds and therapy explained that I do not prescribe Xanax as first line d/t addictive potential discussed healthy coping measures including exercise, meditation, working on hobby f/u in 1-2 weeks      Other Visit Diagnoses     Persistent cough    -  Primary sending tessalon pearles, gave steroid injection, advised on use & SE of meds, still having sinus sx, advised to take generic Sudafed 2-3x/d for the next week. Drink at least 2L water qd.    Relevant Medications   methylPREDNISolone acetate (DEPO-MEDROL) injection 80 mg (Completed)   benzonatate (TESSALON) 200 MG capsule   Lumbar pain    -  given steroid IM injection, sending Robaxin, advised on use & SE. Advised  on using ice with initial pain/re-injury for 24h, switch to heat for up to tid, importance of losing weight to decrease risk of long term pain.    Relevant Medications   methylPREDNISolone acetate (DEPO-MEDROL) injection 80 mg (Completed)   methocarbamol (ROBAXIN) 500 MG tablet      Subjective:    Outpatient Medications Prior to Visit  Medication Sig Dispense Refill   metoprolol tartrate (LOPRESSOR) 100 MG tablet Take 1 tablet (100 mg total) by mouth once for 1 dose. 2 hours before your CT scan 1 tablet 0   No facility-administered medications prior to visit.   Past Medical History:  Diagnosis Date   ADHD    Bipolar affective disorder (HCC)    Heart murmur    SVT (supraventricular tachycardia)    Past Surgical History:  Procedure Laterality Date   APPENDECTOMY      Objective:   Today's Vitals: BP 102/64 (BP Location: Left Arm, Patient Position: Sitting, Cuff Size: Large)   Pulse 87   Temp (!) 97.5 F (36.4 C) (Temporal)   Ht 5\' 9"  (1.753 m)   Wt 297 lb 8 oz (134.9 kg)   SpO2 93%   BMI 43.93 kg/m   Physical Exam Vitals and nursing note reviewed.  Constitutional:      General: He is not in acute distress.  Appearance: Normal appearance. He is obese.  HENT:     Head: Normocephalic.  Cardiovascular:     Rate and Rhythm: Normal rate and regular rhythm.  Pulmonary:     Effort: Pulmonary effort is normal.     Breath sounds: Normal breath sounds.  Musculoskeletal:        General: Normal range of motion.     Cervical back: Normal range of motion.  Skin:    General: Skin is warm and dry.  Neurological:     Mental Status: He is alert and oriented to person, place, and time.  Psychiatric:        Mood and Affect: Mood normal.    Gene Sewer, NP

## 2021-11-30 ENCOUNTER — Ambulatory Visit: Payer: Medicaid Other | Admitting: Family

## 2021-12-02 ENCOUNTER — Telehealth (HOSPITAL_COMMUNITY): Payer: Self-pay | Admitting: *Deleted

## 2021-12-02 NOTE — Telephone Encounter (Signed)
Reaching out to patient to offer assistance regarding upcoming cardiac imaging study; pt verbalizes understanding of appt date/time, parking situation and where to check in, medications ordered, and verified current allergies; name and call back number provided for further questions should they arise  Larey Brick RN Navigator Cardiac Imaging [facility] [phone] office [phone] cell  Patient to take 100mg  metoprolol tartrate two hours prior to his cardiac CT scan. He is aware to arrive at 2pm.

## 2021-12-05 ENCOUNTER — Ambulatory Visit (HOSPITAL_COMMUNITY): Admission: RE | Admit: 2021-12-05 | Payer: Medicaid Other | Source: Ambulatory Visit

## 2021-12-07 ENCOUNTER — Ambulatory Visit: Payer: Medicaid Other | Admitting: Family

## 2021-12-14 ENCOUNTER — Encounter (HOSPITAL_COMMUNITY): Payer: Self-pay

## 2021-12-14 ENCOUNTER — Telehealth (HOSPITAL_COMMUNITY): Payer: Self-pay | Admitting: *Deleted

## 2021-12-14 NOTE — Telephone Encounter (Signed)
Reaching out to patient to offer assistance regarding upcoming cardiac imaging study; pt verbalizes understanding of appt date/time, parking situation and where to check in, medications ordered, and verified current allergies; name and call back number provided for further questions should they arise  Larey Brick RN Navigator Cardiac Imaging [facility] [phone] office [phone] cell  Patient to take 100mg  metoprolol tartrate two hours prior to his cardiac CT scan. He is aware to arrive at 8am..

## 2021-12-16 ENCOUNTER — Ambulatory Visit (HOSPITAL_COMMUNITY)
Admission: RE | Admit: 2021-12-16 | Discharge: 2021-12-16 | Disposition: A | Discharge status: Home / Self Care | Payer: Medicaid Other | Source: Ambulatory Visit | Attending: Internal Medicine | Admitting: Internal Medicine

## 2021-12-16 DIAGNOSIS — R079 Chest pain, unspecified: Secondary | ICD-10-CM | POA: Diagnosis present

## 2021-12-16 MED ORDER — METOPROLOL TARTRATE 5 MG/5ML IV SOLN: 5.0000 mg | INTRAVENOUS | Status: DC | PRN

## 2021-12-16 MED ORDER — METOPROLOL TARTRATE 5 MG/5ML IV SOLN
INTRAVENOUS | Status: AC
  Administered 2021-12-16: 5 mg via INTRAVENOUS
  Filled 2021-12-16: qty 10

## 2021-12-16 MED ORDER — NITROGLYCERIN 0.4 MG SL SUBL
0.8000 mg | SUBLINGUAL_TABLET | Freq: Once | SUBLINGUAL | Status: AC
  Administered 2021-12-16: 0.8 mg via SUBLINGUAL

## 2021-12-16 MED ORDER — NITROGLYCERIN 0.4 MG SL SUBL
SUBLINGUAL_TABLET | SUBLINGUAL | Status: AC
  Filled 2021-12-16: qty 2

## 2021-12-16 MED ORDER — IOHEXOL 350 MG/ML SOLN
100.0000 mL | Freq: Once | INTRAVENOUS | Status: AC | PRN
  Administered 2021-12-16: 100 mL via INTRAVENOUS

## 2022-01-02 ENCOUNTER — Ambulatory Visit: Payer: Medicaid Other | Admitting: Internal Medicine

## 2022-01-05 ENCOUNTER — Encounter: Payer: Self-pay | Admitting: *Deleted

## 2022-04-29 DIAGNOSIS — X509XXA Other and unspecified overexertion or strenuous movements or postures, initial encounter: Secondary | ICD-10-CM | POA: Insufficient documentation

## 2022-04-29 DIAGNOSIS — S93601A Unspecified sprain of right foot, initial encounter: Secondary | ICD-10-CM | POA: Insufficient documentation

## 2022-04-29 DIAGNOSIS — S92014A Nondisplaced fracture of body of right calcaneus, initial encounter for closed fracture: Secondary | ICD-10-CM | POA: Diagnosis not present

## 2022-04-29 DIAGNOSIS — S99921A Unspecified injury of right foot, initial encounter: Secondary | ICD-10-CM | POA: Diagnosis present

## 2022-04-29 DIAGNOSIS — M79671 Pain in right foot: Secondary | ICD-10-CM | POA: Diagnosis not present

## 2022-04-30 ENCOUNTER — Emergency Department (HOSPITAL_COMMUNITY): Payer: Medicaid Other

## 2022-04-30 ENCOUNTER — Other Ambulatory Visit: Payer: Self-pay

## 2022-04-30 ENCOUNTER — Emergency Department (HOSPITAL_COMMUNITY)
Admission: EM | Admit: 2022-04-30 | Discharge: 2022-04-30 | Disposition: A | Discharge status: Home / Self Care | Payer: Medicaid Other | Attending: Emergency Medicine | Admitting: Emergency Medicine

## 2022-04-30 ENCOUNTER — Encounter (HOSPITAL_COMMUNITY): Payer: Self-pay | Admitting: Emergency Medicine

## 2022-04-30 DIAGNOSIS — S93601A Unspecified sprain of right foot, initial encounter: Secondary | ICD-10-CM

## 2022-04-30 DIAGNOSIS — S92014A Nondisplaced fracture of body of right calcaneus, initial encounter for closed fracture: Secondary | ICD-10-CM

## 2022-04-30 MED ORDER — IBUPROFEN 400 MG PO TABS
400.0000 mg | ORAL_TABLET | Freq: Once | ORAL | Status: AC
  Administered 2022-04-30: 400 mg via ORAL
  Filled 2022-04-30: qty 1

## 2022-04-30 NOTE — ED Notes (Signed)
X-ray at bedside

## 2022-04-30 NOTE — ED Triage Notes (Signed)
Pt fell tonight due to left leg being asleep. Pt fell landing on his right leg and foot. Pt c/o severe right foot pain and not being able to move his toes at this time.

## 2022-04-30 NOTE — ED Notes (Signed)
ED Provider at bedside. 

## 2022-04-30 NOTE — ED Provider Notes (Signed)
[place] [facility] AT [facility] Provider Note   CSN: [id number] Arrival date & time: 04/29/22  2357     History  Chief Complaint  Patient presents with   Foot Injury    Gene Berry is a 22 y.o. male.  The history is provided by the patient.  Patient reports he was sleeping, and woke up and he felt that his left leg was numb because it was "asleep" from laying on it When he stood up his left leg gave out and he landed on his right leg and heard something pop.  He has pain in the leg and foot. No other injuries reported. His left leg now feels back to baseline     Home Medications Prior to Admission medications   Not on File      Allergies    Augmentin [amoxicillin-pot clavulanate] and Morphine    Review of Systems   Review of Systems  Physical Exam Updated Vital Signs BP 127/79   Pulse 86   Temp 97.9 F (36.6 C) (Oral)   Resp 16   Ht 1.753 m (5\' 9" )   Wt 134.9 kg   SpO2 100%   BMI 43.92 kg/m  Physical Exam CONSTITUTIONAL: Well developed/well nourished HEAD: Normocephalic/atraumatic EYES: EOMI NEURO: Pt is awake/alert/appropriate, moves all extremitiesx4.  No facial droop.   EXTREMITIES: pulses normal/equal, full ROM Distal pulses equal and intact. Mild tenderness noted  to the right proximal fibula. Diffuse tenderness noted to the right calf Diffuse tenderness noted to the right foot No deformities. The right Achilles is intact SKIN: warm, color normal PSYCH: no abnormalities of mood noted, alert and oriented to situation  ED Results / Procedures / Treatments   Labs (all labs ordered are listed, but only abnormal results are displayed) Labs Reviewed - No data to display  EKG None  Radiology DG Foot Complete Right  Result Date: 04/30/2022 CLINICAL DATA:  Status post trauma. EXAM: RIGHT FOOT COMPLETE - 3+ VIEW COMPARISON:  None Available. FINDINGS: A 3 mm cortical density is seen adjacent to the lateral aspect of the  distal right calcaneus. There is no evidence of dislocation. Chronic changes are seen along the dorsal aspect of the proximal right foot. Soft tissues are unremarkable. IMPRESSION: Findings which may represent a small avulsion fracture of the distal right calcaneus. Correlation with physical examination is recommended to determine the presence of point tenderness. Electronically Signed   By: Aram Candela M.D.   On: 04/30/2022 01:14   DG Tibia/Fibula Right  Result Date: 04/30/2022 CLINICAL DATA:  Status post trauma. EXAM: RIGHT TIBIA AND FIBULA - 2 VIEW COMPARISON:  None Available. FINDINGS: The right tibia and right fibula are intact, without evidence of acute fracture. A 3 mm linear cortical density is seen adjacent to the lateral aspect of the distal right calcaneus. This is of indeterminate age. Chronic appearing changes are along the dorsal aspect of the proximal right foot. There is no evidence of dislocation. Soft tissues are unremarkable. IMPRESSION: Findings which may represent a small avulsion fracture of indeterminate age adjacent to the lateral aspect of the distal right calcaneus. Electronically Signed   By: Aram Candela M.D.   On: 04/30/2022 01:12    Procedures .Splint Application  Date/Time: 04/30/2022 2:12 AM  Performed by: Zadie Rhine, MD Authorized by: Zadie Rhine, MD   Consent:    Consent obtained:  Verbal   Consent given by:  Patient Pre-procedure details:    Distal neurologic exam:  Normal  Distal perfusion: distal pulses strong   Procedure details:    Location:  Leg   Leg location:  R lower leg   Cast type:  Short leg   Splint type:  Short leg Post-procedure details:    Distal neurologic exam:  Normal   Distal perfusion: distal pulses strong     Procedure completion:  Tolerated well, no immediate complications     Medications Ordered in ED Medications  ibuprofen (ADVIL) tablet 400 mg (400 mg Oral Given 04/30/22 0041)    ED Course/ Medical  Decision Making/ A&P Clinical Course as of 04/30/22 7564  Wynelle Link Apr 30, 2022  0211 Patient found to have possible avulsion fracture to the right calcaneus.  However most of his pain is on the dorsal surface of the foot, he reports previous injury to his tendons.  Given his pain and possible injury, will place in a splint and crutches and follow-up with orthopedics.  Patient agreeable with plan [DW]    Clinical Course User Index [DW] Zadie Rhine, MD                             Medical Decision Making Amount and/or Complexity of Data Reviewed Radiology: ordered.  Risk Prescription drug management.           Final Clinical Impression(s) / ED Diagnoses Final diagnoses:  Sprain of right foot, initial encounter  Closed nondisplaced fracture of body of right calcaneus, initial encounter    Rx / DC Orders ED Discharge Orders     None         Zadie Rhine, MD 04/30/22 [phone]

## 2022-05-03 ENCOUNTER — Encounter: Payer: Self-pay | Admitting: Orthopedic Surgery

## 2022-05-03 ENCOUNTER — Ambulatory Visit: Payer: Medicaid Other | Admitting: Orthopedic Surgery

## 2022-05-03 DIAGNOSIS — S92034A Nondisplaced avulsion fracture of tuberosity of right calcaneus, initial encounter for closed fracture: Secondary | ICD-10-CM

## 2022-05-03 MED ORDER — HYDROCODONE-ACETAMINOPHEN 5-325 MG PO TABS: 1.0000 | ORAL_TABLET | Freq: Four times a day (QID) | ORAL | 0 refills | Status: DC | PRN

## 2022-05-03 NOTE — Patient Instructions (Addendum)
Please provide a note for work - out until the next visit  Instructions  1.  You have sustained an ankle sprain, or similar injury that can be treated as an ankle sprain.  **These exercises can also be used as part of recovery from an ankle fracture.  2.  I encourage you to stay on your feet and gradually remove your walking boot.   3.  Below are some exercises that you can complete on your own to improve your symptoms.  4.  As an alternative, you can search for ankle sprain exercises online, and can see some demonstrations on YouTube  5.  If you are having difficulty with these exercises, we can also prescribe formal physical therapy  Ankle Exercises Ask your health care provider which exercises are safe for you. Do exercises exactly as told by your health care provider and adjust them as directed. It is normal to feel mild stretching, pulling, tightness, or mild discomfort as you do these exercises. Stop right away if you feel sudden pain or your pain gets worse. Do not begin these exercises until told by your health care provider.  Stretching and range-of-motion exercises These exercises warm up your muscles and joints and improve the movement and flexibility of your ankle. These exercises may also help to relieve pain.  Dorsiflexion/plantar flexion  Sit with your R knee straight or bent. Do not rest your foot on anything. Flex your left ankle to tilt the top of your foot toward your shin. This is called dorsiflexion. Hold this position for 5 seconds. Point your toes downward to tilt the top of your foot away from your shin. This is called plantar flexion. Hold this position for 5 seconds. Repeat 10 times. Complete this exercise 2-3 times a day.  As tolerated  Ankle alphabet  Sit with your R foot supported at your lower leg. Do not rest your foot on anything. Make sure your foot has room to move freely. Think of your R foot as a paintbrush: Move your foot to trace each letter of the  alphabet in the air. Keep your hip and knee still while you trace the letters. Trace every letter from A to Z. Make the letters as large as you can without causing or increasing any discomfort.  Repeat 2-3 times. Complete this exercise 2-3 times a day.   Strengthening exercises These exercises build strength and endurance in your ankle. Endurance is the ability to use your muscles for a long time, even after they get tired. Dorsiflexors These are muscles that lift your foot up. Secure a rubber exercise band or tube to an object, such as a table leg, that will stay still when the band is pulled. Secure the other end around your R foot. Sit on the floor, facing the object with your R leg extended. The band or tube should be slightly tense when your foot is relaxed. Slowly flex your R ankle and toes to bring your foot toward your shin. Hold this position for 5 seconds. Slowly return your foot to the starting position, controlling the band as you do that. Repeat 10 times. Complete this exercise 2-3 times a day.  Plantar flexors These are muscles that push your foot down. Sit on the floor with your R leg extended. Loop a rubber exercise band or tube around the ball of your R foot. The ball of your foot is on the walking surface, right under your toes. The band or tube should be slightly tense when  your foot is relaxed. Slowly point your toes downward, pushing them away from you. Hold this position for 5 seconds. Slowly release the tension in the band or tube, controlling smoothly until your foot is back in the starting position. Repeat 10 times. Complete this exercise 2-3 times a day.  Towel curls  Sit in a chair on a non-carpeted surface, and put your feet on the floor. Place a towel in front of your feet. Keeping your heel on the floor, put your R foot on the towel. Pull the towel toward you by grabbing the towel with your toes and curling them under. Keep your heel on the floor. Let  your toes relax. Grab the towel again. Keep pulling the towel until it is completely underneath your foot. Repeat 10 times. Complete this exercise 2-3 times a day.  Standing plantar flexion This is an exercise in which you use your toes to lift your body's weight while standing. Stand with your feet shoulder-width apart. Keep your weight spread evenly over the width of your feet while you rise up on your toes. Use a wall or table to steady yourself if needed, but try not to use it for support. If this exercise is too easy, try these options: Shift your weight toward your R leg until you feel challenged. If told by your health care provider, lift your uninjured leg off the floor. Hold this position for 5 seconds. Repeat 10 times. Complete this exercise 2-3 times a day.  Tandem walking Stand with one foot directly in front of the other. Slowly raise your back foot up, lifting your heel before your toes, and place it directly in front of your other foot. Continue to walk in this heel-to-toe way. Have a countertop or wall nearby to use if needed to keep your balance, but try not to hold onto anything for support.  Repeat 10 times. Complete this exercise 2-3 times a day.   Document Revised: 10/06/2017 Document Reviewed: 10/08/2017 Elsevier Patient Education  2020 [employer].

## 2022-05-05 ENCOUNTER — Encounter: Payer: Self-pay | Admitting: Orthopedic Surgery

## 2022-05-05 NOTE — Progress Notes (Signed)
New Patient Visit  Assessment: Gene Berry is a 22 y.o. male with the following: 1. Closed nondisplaced avulsion fracture of tuberosity of right calcaneus, initial encounter  Plan: Gene Berry rolled his ankle a few days ago, and has an avulsion fracture of the right calcaneus.  This can be treated with weightbearing as tolerated, and a walking boot.  He should remove the boot to initiate range of motion activities immediately.  Elevate the foot to help with pain and swelling.  Medications as needed.  Follow-up in 2 weeks.  Follow-up: Return in about 2 weeks (around 05/17/2022).  Subjective:  Chief Complaint  Patient presents with   Fracture    R foot DOI 04/29/22    History of Present Illness: Gene Berry is a 22 y.o. male who presents for evaluation of right foot pain.  A few days ago, he states he was seated for a long period of time, and started develop some numbness in his lower extremities.  He started to walk, and his right foot buckled underneath him.  He had immediate pain.  He was evaluated in the emergency department, placed in a splint.  He has an avulsion fracture off the calcaneus.  Since then, he has remained nonweightbearing.  He is using crutches.  He is taking pain medication.   Review of Systems: No fevers or chills No numbness or tingling No chest pain No shortness of breath No bowel or bladder dysfunction No GI distress No headaches   Medical History:  Past Medical History:  Diagnosis Date   ADHD    Bipolar affective disorder    Heart murmur    SVT (supraventricular tachycardia)     Past Surgical History:  Procedure Laterality Date   APPENDECTOMY      Family History  Problem Relation Age of Onset   Anxiety disorder Mother    Hypertension Father    Cancer - Other Father    Heart attack Maternal Grandmother    Heart attack Maternal Grandfather    Cirrhosis Maternal Grandfather    Stroke Maternal Grandfather    Heart attack Paternal  Grandmother    Heart attack Paternal Grandfather    Social History   Tobacco Use   Smoking status: Former    Packs/day: 2.00    Years: 4.00    Additional pack years: 0.00    Total pack years: 8.00    Types: Cigarettes    Quit date: 10/27/2019    Years since quitting: 2.5    Passive exposure: Never   Smokeless tobacco: Former    Types: Chew   Tobacco comments:    smokes 5-6 cigarettes per day, vapes all other times-01/29/2020  Vaping Use   Vaping Use: Every day  Substance Use Topics   Alcohol use: Yes    Alcohol/week: 1.0 standard drink of alcohol    Types: 1 Standard drinks or equivalent per week   Drug use: Not Currently    Types: Marijuana    Comment: cbd gummies    Allergies  Allergen Reactions   Augmentin [Amoxicillin-Pot Clavulanate] Anaphylaxis   Morphine Anaphylaxis    Current Meds  Medication Sig   HYDROcodone-acetaminophen (NORCO/VICODIN) 5-325 MG tablet Take 1 tablet by mouth every 6 (six) hours as needed for moderate pain.    Objective: There were no vitals taken for this visit.  Physical Exam:  General: Alert and oriented. and No acute distress. Gait: Ambulates with the assistance of crutches.  Right foot with minimal swelling.  No bruising.  Mild  tenderness palpation of the lateral ankle.  Pain with inversion.  He has good range of motion otherwise.  Sensation intact to the dorsum of the foot.  Toes are warm and well-perfused.  IMAGING: I personally reviewed images previously obtained from the ED   IMPRESSION: Findings which may represent a small avulsion fracture of the distal right calcaneus. Correlation with physical examination is recommended to determine the presence of point tenderness.   New Medications:  Meds ordered this encounter  Medications   HYDROcodone-acetaminophen (NORCO/VICODIN) 5-325 MG tablet    Sig: Take 1 tablet by mouth every 6 (six) hours as needed for moderate pain.    Dispense:  20 tablet    Refill:  0       Oliver Barre, MD  05/05/2022 8:06 AM

## 2022-05-17 ENCOUNTER — Encounter: Payer: Self-pay | Admitting: Orthopedic Surgery

## 2022-05-17 ENCOUNTER — Other Ambulatory Visit (INDEPENDENT_AMBULATORY_CARE_PROVIDER_SITE_OTHER): Payer: Medicaid Other

## 2022-05-17 ENCOUNTER — Ambulatory Visit: Payer: Medicaid Other | Admitting: Orthopedic Surgery

## 2022-05-17 DIAGNOSIS — S92034D Nondisplaced avulsion fracture of tuberosity of right calcaneus, subsequent encounter for fracture with routine healing: Secondary | ICD-10-CM | POA: Diagnosis not present

## 2022-05-17 NOTE — Progress Notes (Signed)
Return patient Visit  Assessment: Gene Berry is a 22 y.o. male with the following: 1. Closed nondisplaced avulsion fracture of tuberosity of right calcaneus, subsequent encounter  Plan: Gene Berry rolled his ankle a couple of weeks ago, and has an avulsion fracture of the right calcaneus.  He has weaned himself out of the walking boot.  He is in a regular shoe.  His pain is much better.  He is taking ibuprofen, primarily at nighttime.  He has occasional pains in his right knee.  No instability on exam.  He has no swelling.  Low concern for an intra-articular knee injury at this time.  However, if the symptoms persist he should return to clinic.   Follow-up: Return if symptoms worsen or fail to improve.  Subjective:  Chief Complaint  Patient presents with   Fracture    R foot DOI 04/29/22    History of Present Illness: Gene Berry is a 22 y.o. male who returns for evaluation of right foot pain.  A couple of weeks ago, he rolled his ankle.  Had an avulsion fracture off the calcaneus.  He has been in a walking boot, but is no longer using the walking boot.  He notes mild discomfort in the right ankle.  He is taking ibuprofen, once daily.  Occasionally, he will have locking sensation in the right knee.  He has not noticed any swelling.  Limited pain in the knee otherwise.  Review of Systems: No fevers or chills No numbness or tingling No chest pain No shortness of breath No bowel or bladder dysfunction No GI distress No headaches   Objective: There were no vitals taken for this visit.  Physical Exam:  General: Alert and oriented. and No acute distress. Gait: Nonantalgic gait in regular shoes.  Right foot and ankle without swelling.  No point tenderness.  Good strength.  Minimal discomfort with inversion and eversion of the ankle.  Right knee without swelling.  No tenderness palpation on the medial lateral joint line.  He has good range of motion.  No increased laxity varus  or valgus stress.  Negative Lachman.  IMAGING: I personally ordered and reviewed the following images  X-rays of the right ankle were obtained in clinic today.  These are nonweightbearing views.  Mortise is congruent.  No syndesmotic disruption.  There are some small cortical fragments on the dorsal and lateral aspect of the foot and the ankle.  No change in appearance of these fragments.  No additional injuries.  No bony lesions.  Impression: Stable right ankle following inversion injury   New Medications:  No orders of the defined types were placed in this encounter.     Oliver Barre, MD  05/17/2022 2:12 PM

## 2022-06-05 ENCOUNTER — Telehealth: Payer: Self-pay | Admitting: Orthopedic Surgery

## 2022-06-05 NOTE — Telephone Encounter (Signed)
Called [facility], spoke w/Ashley, advised what Dr. Dallas Schimke response was.

## 2022-06-05 NOTE — Telephone Encounter (Signed)
Dr. Dallas Schimke pt - [facility] [phone] lvm stating that Dr. Dallas Schimke sent in a script for Hydrocodone for this patient on 05/03/22, they filled it, but the patient never picked it up.  Now the patient is wanting it, but they need approval to fill it since it's been over 30 days.

## 2022-06-06 ENCOUNTER — Other Ambulatory Visit: Payer: Self-pay | Admitting: Orthopedic Surgery

## 2022-07-20 ENCOUNTER — Ambulatory Visit
Admission: EM | Admit: 2022-07-20 | Discharge: 2022-07-20 | Disposition: A | Discharge status: Home / Self Care | Payer: Medicaid Other | Attending: Emergency Medicine | Admitting: Emergency Medicine

## 2022-07-20 ENCOUNTER — Encounter: Payer: Self-pay | Admitting: Emergency Medicine

## 2022-07-20 DIAGNOSIS — J0100 Acute maxillary sinusitis, unspecified: Secondary | ICD-10-CM | POA: Insufficient documentation

## 2022-07-20 DIAGNOSIS — Z20822 Contact with and (suspected) exposure to covid-19: Secondary | ICD-10-CM | POA: Insufficient documentation

## 2022-07-20 DIAGNOSIS — J988 Other specified respiratory disorders: Secondary | ICD-10-CM | POA: Insufficient documentation

## 2022-07-20 DIAGNOSIS — B9789 Other viral agents as the cause of diseases classified elsewhere: Secondary | ICD-10-CM | POA: Insufficient documentation

## 2022-07-20 LAB — POCT INFLUENZA A/B
Influenza A, POC: NEGATIVE
Influenza B, POC: NEGATIVE — AB

## 2022-07-20 MED ORDER — FLUTICASONE PROPIONATE 50 MCG/ACT NA SUSP: 2.0000 | Freq: Every day | NASAL | 0 refills | Status: DC

## 2022-07-20 MED ORDER — IBUPROFEN 600 MG PO TABS: 600.0000 mg | ORAL_TABLET | Freq: Three times a day (TID) | ORAL | 0 refills | Status: DC | PRN

## 2022-07-20 MED ORDER — PROMETHAZINE-DM 6.25-15 MG/5ML PO SYRP: 5.0000 mL | ORAL_SOLUTION | Freq: Four times a day (QID) | ORAL | 0 refills | Status: DC | PRN

## 2022-07-20 MED ORDER — ALBUTEROL SULFATE HFA 108 (90 BASE) MCG/ACT IN AERS: 1.0000 | INHALATION_SPRAY | RESPIRATORY_TRACT | 0 refills | Status: DC | PRN

## 2022-07-20 MED ORDER — IPRATROPIUM BROMIDE 0.06 % NA SOLN: 2.0000 | Freq: Three times a day (TID) | NASAL | 0 refills | Status: DC

## 2022-07-20 NOTE — Discharge Instructions (Signed)
Your influenza was negative.  Your COVID will be back in 6 to 24 hours.  Start Mucinex to keep the mucous thin and to decongest you.   You may take 600 mg of motrin with 1000 mg of tylenol up to 3-4 times a day as needed for pain. This is an effective combination for pain.  Most sinus infections are viral and do not need antibiotics unless you have a high fever, have had this for 10 days, or you get better and then get sick again. Use a NeilMed sinus rinse with distilled water as often as you want to to reduce nasal congestion. Follow the directions on the box.   Flonase will help with postnasal drip.  2 puffs from your albuterol inhaler using your spacer every 4-6 hours as needed for coughing, wheezing, shortness of breath.  This will open up your lungs.  Promethazine DM for cough.  Go to www.goodrx.com to look up your medications. This will give you a list of where you can find your prescriptions at the most affordable prices. Or you can ask the pharmacist what the cash price is. This is frequently cheaper than going through insurance.

## 2022-07-20 NOTE — ED Provider Notes (Signed)
HPI  SUBJECTIVE:  Gene Berry is a 22 y.o. male who presents with 2 days of cough productive of clear mucus, headache, chills, generalized weakness, decreased appetite, nasal congestion, clear rhinorrhea, sinus pain and pressure, postnasal drip, sore throat secondary to the cough, shortness of breath with exertion and diarrhea.  He reports feeling feverish this morning, but did not measure his temperature.  No facial swelling, upper dental pain, wheezing, nausea, vomiting, abdominal pain.  No known COVID or flu exposure.  He states this feels like his previous episode of COVID.  He has had 2 doses of COVID-vaccine.  He was up all night last night coughing.  No antibiotics in the past month.  No antipyretic in the past 6 hours.  He tried rest without improvement in his symptoms.  Symptoms worse with exertion.  He is a smoker.  He has a past medical history of COVID that was treated supportively without antivirals.  He also has a history of SVT that did not require any treatment, BMI above 30, and is status post appendectomy.  PCP: None.    Past Medical History:  Diagnosis Date   ADHD    Bipolar affective disorder (HCC)    Heart murmur    SVT (supraventricular tachycardia)     Past Surgical History:  Procedure Laterality Date   APPENDECTOMY      Family History  Problem Relation Age of Onset   Anxiety disorder Mother    Hypertension Father    Cancer - Other Father    Heart attack Maternal Grandmother    Heart attack Maternal Grandfather    Cirrhosis Maternal Grandfather    Stroke Maternal Grandfather    Heart attack Paternal Grandmother    Heart attack Paternal Grandfather     Social History   Tobacco Use   Smoking status: Former    Packs/day: 2.00    Years: 4.00    Additional pack years: 0.00    Total pack years: 8.00    Types: Cigarettes    Quit date: 10/27/2019    Years since quitting: 2.7    Passive exposure: Never   Smokeless tobacco: Former    Types: Chew   Tobacco  comments:    smokes 5-6 cigarettes per day, vapes all other times-01/29/2020  Vaping Use   Vaping Use: Every day  Substance Use Topics   Alcohol use: Yes    Alcohol/week: 1.0 standard drink of alcohol    Types: 1 Standard drinks or equivalent per week   Drug use: Not Currently    Types: Marijuana    Comment: cbd gummies    No current facility-administered medications for this encounter.  Current Outpatient Medications:    albuterol (VENTOLIN HFA) 108 (90 Base) MCG/ACT inhaler, Inhale 1-2 puffs into the lungs every 4 (four) hours as needed for wheezing or shortness of breath., Disp: 1 each, Rfl: 0   fluticasone (FLONASE) 50 MCG/ACT nasal spray, Place 2 sprays into both nostrils daily., Disp: 16 g, Rfl: 0   ibuprofen (ADVIL) 600 MG tablet, Take 1 tablet (600 mg total) by mouth every 8 (eight) hours as needed., Disp: 30 tablet, Rfl: 0   promethazine-dextromethorphan (PROMETHAZINE-DM) 6.25-15 MG/5ML syrup, Take 5 mLs by mouth 4 (four) times daily as needed for cough., Disp: 118 mL, Rfl: 0  Allergies  Allergen Reactions   Augmentin [Amoxicillin-Pot Clavulanate] Anaphylaxis   Morphine Anaphylaxis     ROS  As noted in HPI.   Physical Exam  BP 123/80 (BP Location: Right Arm)  Pulse 91   Temp 98.8 F (37.1 C) (Oral)   Resp 18   SpO2 97%   Constitutional: Well developed, well nourished, no acute distress Eyes:  EOMI, conjunctiva normal bilaterally HENT: Normocephalic, atraumatic,mucus membranes moist.  Erythematous, swollen turbinates.  Clear nasal congestion.  Positive maxillary sinus tenderness.  No frontal sinus tenderness.  Normal tonsils without exudates.  Uvula midline. Neck: No cervical lymphadenopathy Respiratory: Normal inspiratory effort, lungs clear bilaterally Cardiovascular: Normal rate, regular rhythm, no murmurs rubs or gallops GI: nondistended skin: No rash, skin intact Musculoskeletal: no deformities Neurologic: Alert & oriented x 3, no focal neuro  deficits Psychiatric: Speech and behavior appropriate   ED Course   Medications - No data to display  Orders Placed This Encounter  Procedures   SARS CORONAVIRUS 2 (TAT 6-24 HRS) Anterior Nasal Swab    Standing Status:   Standing    Number of Occurrences:   1   POCT Influenza A/B    Standing Status:   Standing    Number of Occurrences:   1    Results for orders placed or performed during the hospital encounter of 07/20/22 (from the past 24 hour(s))  POCT Influenza A/B     Status: Abnormal   Collection Time: 07/20/22  4:39 PM  Result Value Ref Range   Influenza A, POC Negative Negative   Influenza B, POC Negative (A) Negative   No results found.  ED Clinical Impression  1. Viral respiratory infection   2. Encounter for laboratory testing for COVID-19 virus   3. Acute non-recurrent maxillary sinusitis      ED Assessment/Plan     Checking flu, COVID.  Patient does not want antivirals if COVID is positive.  Home with Mucinex, Tylenol/ibuprofen, and albuterol inhaler with a spacer, because he is a smoker reports shortness of breath with exertion.  Flonase, saline nasal irrigation, Promethazine DM.  Work note  Flu negative.  Patient presents with an upper respiratory infection and acute maxillary sinusitis, most likely viral.  He does not qualify for antibiotics at this time.  Discussed this with patient.  Will refer to [place] primary care for root team care.  May return here for double sickening or if not better in a week, we can consider antibiotics for sinusitis.  Discussed labs,  MDM, treatment plan, and plan for follow-up with patient.  patient agrees with plan.   Meds ordered this encounter  Medications   albuterol (VENTOLIN HFA) 108 (90 Base) MCG/ACT inhaler    Sig: Inhale 1-2 puffs into the lungs every 4 (four) hours as needed for wheezing or shortness of breath.    Dispense:  1 each    Refill:  0   fluticasone (FLONASE) 50 MCG/ACT nasal spray    Sig:  Place 2 sprays into both nostrils daily.    Dispense:  16 g    Refill:  0   DISCONTD: ipratropium (ATROVENT) 0.06 % nasal spray    Sig: Place 2 sprays into both nostrils 3 (three) times daily.    Dispense:  15 mL    Refill:  0   promethazine-dextromethorphan (PROMETHAZINE-DM) 6.25-15 MG/5ML syrup    Sig: Take 5 mLs by mouth 4 (four) times daily as needed for cough.    Dispense:  118 mL    Refill:  0   ibuprofen (ADVIL) 600 MG tablet    Sig: Take 1 tablet (600 mg total) by mouth every 8 (eight) hours as needed.    Dispense:  30 tablet  Refill:  0      *This clinic note was created using Scientist, clinical (histocompatibility and immunogenetics). Therefore, there may be occasional mistakes despite careful proofreading.  ?    Domenick Gong, MD 07/20/22 2000

## 2022-07-20 NOTE — ED Triage Notes (Signed)
Cough x 2 days.  Cold chills last night with headache.  States he feels weak.

## 2022-07-21 LAB — SARS CORONAVIRUS 2 (TAT 6-24 HRS): SARS Coronavirus 2: NEGATIVE

## 2022-10-06 ENCOUNTER — Encounter (HOSPITAL_COMMUNITY): Payer: Self-pay

## 2022-10-06 ENCOUNTER — Emergency Department (HOSPITAL_COMMUNITY): Payer: Medicaid Other

## 2022-10-06 ENCOUNTER — Emergency Department (HOSPITAL_COMMUNITY)
Admission: EM | Admit: 2022-10-06 | Discharge: 2022-10-06 | Disposition: A | Discharge status: Home / Self Care | Payer: Medicaid Other | Attending: Emergency Medicine | Admitting: Emergency Medicine

## 2022-10-06 ENCOUNTER — Other Ambulatory Visit: Payer: Self-pay

## 2022-10-06 DIAGNOSIS — Z1152 Encounter for screening for COVID-19: Secondary | ICD-10-CM | POA: Insufficient documentation

## 2022-10-06 DIAGNOSIS — R112 Nausea with vomiting, unspecified: Secondary | ICD-10-CM | POA: Insufficient documentation

## 2022-10-06 DIAGNOSIS — R1032 Left lower quadrant pain: Secondary | ICD-10-CM | POA: Diagnosis present

## 2022-10-06 DIAGNOSIS — R197 Diarrhea, unspecified: Secondary | ICD-10-CM | POA: Insufficient documentation

## 2022-10-06 LAB — COMPREHENSIVE METABOLIC PANEL
ALT: 23 U/L (ref 0–44)
AST: 18 U/L (ref 15–41)
Albumin: 3.8 g/dL (ref 3.5–5.0)
Alkaline Phosphatase: 67 U/L (ref 38–126)
Anion gap: 8 (ref 5–15)
BUN: 13 mg/dL (ref 6–20)
CO2: 23 mmol/L (ref 22–32)
Calcium: 8.5 mg/dL — ABNORMAL LOW (ref 8.9–10.3)
Chloride: 104 mmol/L (ref 98–111)
Creatinine, Ser: 0.94 mg/dL (ref 0.61–1.24)
GFR, Estimated: >60 mL/min (ref >60)
Glucose, Bld: 97 mg/dL (ref 70–99)
Potassium: 3.7 mmol/L (ref 3.5–5.1)
Sodium: 135 mmol/L (ref 135–145)
Total Bilirubin: 0.2 mg/dL — ABNORMAL LOW (ref 0.3–1.2)
Total Protein: 7 g/dL (ref 6.5–8.1)

## 2022-10-06 LAB — URINALYSIS, ROUTINE W REFLEX MICROSCOPIC
Bilirubin Urine: NEGATIVE
Glucose, UA: NEGATIVE mg/dL
Hgb urine dipstick: NEGATIVE
Ketones, ur: NEGATIVE mg/dL
Leukocytes,Ua: NEGATIVE
Nitrite: NEGATIVE
Protein, ur: NEGATIVE mg/dL
Specific Gravity, Urine: 1.017 (ref 1.005–1.030)
pH: 6 (ref 5.0–8.0)

## 2022-10-06 LAB — CBC
HCT: 45.4 % (ref 39.0–52.0)
Hemoglobin: 14.8 g/dL (ref 13.0–17.0)
MCH: 28.6 pg (ref 26.0–34.0)
MCHC: 32.6 g/dL (ref 30.0–36.0)
MCV: 87.6 fL (ref 80.0–100.0)
Platelets: 278 10*3/uL (ref 150–400)
RBC: 5.18 MIL/uL (ref 4.22–5.81)
RDW: 12.8 % (ref 11.5–15.5)
WBC: 7.9 10*3/uL (ref 4.0–10.5)
nRBC: 0 % (ref 0.0–0.2)

## 2022-10-06 LAB — LIPASE, BLOOD: Lipase: 27 U/L (ref 11–51)

## 2022-10-06 LAB — SARS CORONAVIRUS 2 BY RT PCR: SARS Coronavirus 2 by RT PCR: NEGATIVE

## 2022-10-06 MED ORDER — IOHEXOL 300 MG/ML  SOLN
100.0000 mL | Freq: Once | INTRAMUSCULAR | Status: AC | PRN
  Administered 2022-10-06: 100 mL via INTRAVENOUS

## 2022-10-06 MED ORDER — ONDANSETRON HCL 4 MG/2ML IJ SOLN
4.0000 mg | Freq: Once | INTRAMUSCULAR | Status: AC
  Administered 2022-10-06: 4 mg via INTRAVENOUS
  Filled 2022-10-06: qty 2

## 2022-10-06 MED ORDER — ONDANSETRON HCL 4 MG PO TABS: 4.0000 mg | ORAL_TABLET | Freq: Four times a day (QID) | ORAL | 0 refills | Status: DC

## 2022-10-06 MED ORDER — SODIUM CHLORIDE 0.9 % IV BOLUS
1000.0000 mL | Freq: Once | INTRAVENOUS | Status: AC
  Administered 2022-10-06: 1000 mL via INTRAVENOUS

## 2022-10-06 MED ORDER — PANTOPRAZOLE SODIUM 40 MG PO TBEC: 40.0000 mg | DELAYED_RELEASE_TABLET | Freq: Every day | ORAL | 0 refills | Status: DC

## 2022-10-06 NOTE — ED Provider Notes (Signed)
Patient is here for LLQ abdominal pain. Waiting for CT abdomen read. Physical Exam  BP 121/72 (BP Location: Left Arm)   Pulse 66   Temp 98.2 F (36.8 C) (Oral)   Resp 16   Wt 134 kg   SpO2 99%   BMI 43.63 kg/m   Physical Exam  Procedures  Procedures  ED Course / MDM    Medical Decision Making Amount and/or Complexity of Data Reviewed Labs: ordered. Radiology: ordered.  Risk Prescription drug management.   Patient handed off from [place], [place].  Please see their note for full HPI and physical exam findings.  Plan at time of handoff is to wait for CT imaging of the abdomen.  Low concern for any more concerning pathology that require admission.  CT imaging is unremarkable.  Mentions possible diverticula but no signs of diverticulitis.  Discussed management of symptoms at home with medications including Protonix and Zofran. Will discharge patient home.    Patient discharged home in stable condition.       Smitty Knudsen, PA-C 10/06/22 2352    Bethann Berkshire, MD 10/09/22 [phone]

## 2022-10-06 NOTE — ED Provider Notes (Signed)
[place] EMERGENCY DEPARTMENT AT [facility] Provider Note   CSN: [id number] Arrival date & time: 10/06/22  1429     History  Chief Complaint  Patient presents with   Abdominal Pain    Gene Berry is a 22 y.o. male.   Abdominal Pain Associated symptoms: diarrhea, nausea and vomiting   Associated symptoms: no chest pain, no chills, no dysuria, no fever and no shortness of breath        Gene Berry is a 22 y.o. male who presents to the Emergency Department complaining of left lower quadrant pain, nausea, vomiting, and diarrhea symptoms present for 2 weeks.  Describes a cramping and bloating sensation of his abdomen.  Pain sometimes worsens with certain food.  Denies any fever, flank pain or dysuria.  No prior abdominal surgeries.    Home Medications Prior to Admission medications   Medication Sig Start Date End Date Taking? Authorizing Provider  albuterol (VENTOLIN HFA) 108 (90 Base) MCG/ACT inhaler Inhale 1-2 puffs into the lungs every 4 (four) hours as needed for wheezing or shortness of breath. 07/20/22   Domenick Gong, MD  fluticasone (FLONASE) 50 MCG/ACT nasal spray Place 2 sprays into both nostrils daily. 07/20/22   Domenick Gong, MD  ibuprofen (ADVIL) 600 MG tablet Take 1 tablet (600 mg total) by mouth every 8 (eight) hours as needed. 07/20/22   Domenick Gong, MD  promethazine-dextromethorphan (PROMETHAZINE-DM) 6.25-15 MG/5ML syrup Take 5 mLs by mouth 4 (four) times daily as needed for cough. 07/20/22   Domenick Gong, MD      Allergies    Augmentin [amoxicillin-pot clavulanate] and Morphine    Review of Systems   Review of Systems  Constitutional:  Negative for appetite change, chills and fever.  Respiratory:  Negative for shortness of breath.   Cardiovascular:  Negative for chest pain.  Gastrointestinal:  Positive for abdominal pain, diarrhea, nausea and vomiting.  Genitourinary:  Negative for dysuria and flank pain.  Musculoskeletal:   Negative for back pain.  Neurological:  Negative for dizziness, weakness and numbness.    Physical Exam Updated Vital Signs BP 114/69   Pulse 62   Temp 98.2 F (36.8 C) (Oral)   Resp 16   Wt 134 kg   SpO2 97%   BMI 43.63 kg/m  Physical Exam Vitals and nursing note reviewed.  Constitutional:      General: He is not in acute distress.    Appearance: He is well-developed. He is not ill-appearing or toxic-appearing.  HENT:     Mouth/Throat:     Mouth: Mucous membranes are moist.  Cardiovascular:     Rate and Rhythm: Normal rate and regular rhythm.     Pulses: Normal pulses.  Pulmonary:     Effort: Pulmonary effort is normal.  Abdominal:     General: There is no distension.     Palpations: Abdomen is soft.     Tenderness: There is abdominal tenderness.     Comments: Mild tenderness palpation left lower quadrant.  Abdomen is soft no guarding or rebound tenderness.  Musculoskeletal:        General: Normal range of motion.  Skin:    General: Skin is warm.     Capillary Refill: Capillary refill takes less than 2 seconds.  Neurological:     General: No focal deficit present.     Mental Status: He is alert.     Sensory: No sensory deficit.     Motor: No weakness.     ED Results /  Procedures / Treatments   Labs (all labs ordered are listed, but only abnormal results are displayed) Labs Reviewed  COMPREHENSIVE METABOLIC PANEL - Abnormal; Notable for the following components:      Result Value   Calcium 8.5 (*)    Total Bilirubin 0.2 (*)    All other components within normal limits  SARS CORONAVIRUS 2 BY RT PCR  LIPASE, BLOOD  CBC  URINALYSIS, ROUTINE W REFLEX MICROSCOPIC    EKG None  Radiology No results found.  Procedures Procedures    Medications Ordered in ED Medications  sodium chloride 0.9 % bolus 1,000 mL (0 mLs Intravenous Stopped 10/06/22 1846)  ondansetron (ZOFRAN) injection 4 mg (4 mg Intravenous Given 10/06/22 1732)  iohexol (OMNIPAQUE) 300 MG/ML  solution 100 mL (100 mLs Intravenous Contrast Given 10/06/22 1826)    ED Course/ Medical Decision Making/ A&P                                 Medical Decision Making Patient here with nausea vomiting diarrhea and left lower quadrant pain.  Symptoms x 2 weeks.  Worsens with certain foods.  No fever or chills.  No history of prior abdominal surgeries.  I suspect symptoms are viral.  Patient is well-appearing he does not appear toxic.  Diverticulitis also considered but felt less likely in a patient of his age.  No active vomiting or diarrhea here  Amount and/or Complexity of Data Reviewed Labs: ordered.    Details: Labs interpreted by me, unremarkable Radiology: ordered.    Details: CT abdomen and pelvis pending Discussion of management or test interpretation with external provider(s): I suspect patient's symptoms are viral.  He is well-appearing nontoxic.  Vital signs are reassuring. If CT abdomen pelvis negative, patient will be discharged home with outpatient follow-up, PPI and Zofran.  Discussed with Maryanna Shape, PA-C who agrees to review CT results and dispo  Risk Prescription drug management.           Final Clinical Impression(s) / ED Diagnoses Final diagnoses:  Left lower quadrant abdominal pain    Rx / DC Orders ED Discharge Orders     None         Rosey Bath 10/06/22 1952    Bethann Berkshire, MD 10/09/22 [phone]

## 2022-10-06 NOTE — Discharge Instructions (Addendum)
Avoid spicy greasy foods and alcohol.  Given prescribed medication to help your abdominal symptoms and medication for nausea vomiting.  I have provided follow-up information for local primary care so that you may establish care.  Return to the emergency department for any new or worsening symptoms.

## 2022-10-06 NOTE — ED Triage Notes (Signed)
C/o intermittent  LLQ abd pain with n/v/d and feeling bloated x2 weeks

## 2022-10-06 NOTE — ED Notes (Signed)
Introduced self to pt Pt stated that he has LLQ ABD pain that radiates up to his chest x 3days Pt complains or nausea and diarrhea Pt denies fevers  Attached pt to partial monitor  Vitals listed Call bel at bedside

## 2022-10-06 NOTE — ED Notes (Signed)
Pt off to CT.

## 2022-10-06 NOTE — ED Notes (Signed)
Pt ambulated to and from restroom without difficulty or assistance

## 2022-12-12 NOTE — Progress Notes (Unsigned)
   New Patient Office Visit   Subjective   Patient ID: Gene Berry, male    DOB: 2000/02/22  Age: 22 y.o. MRN: [id number]  CC: No chief complaint on file.   HPI [name] 22 year old male, presents to establish care. He  has a past medical history of ADHD, Bipolar affective disorder (HCC), Heart murmur, and SVT (supraventricular tachycardia).  HPI    Outpatient Encounter Medications as of 12/13/2022  Medication Sig   albuterol (VENTOLIN HFA) 108 (90 Base) MCG/ACT inhaler Inhale 1-2 puffs into the lungs every 4 (four) hours as needed for wheezing or shortness of breath.   fluticasone (FLONASE) 50 MCG/ACT nasal spray Place 2 sprays into both nostrils daily.   ibuprofen (ADVIL) 600 MG tablet Take 1 tablet (600 mg total) by mouth every 8 (eight) hours as needed.   ondansetron (ZOFRAN) 4 MG tablet Take 1 tablet (4 mg total) by mouth every 6 (six) hours. As needed for nausea vomiting   pantoprazole (PROTONIX) 40 MG tablet Take 1 tablet (40 mg total) by mouth daily.   promethazine-dextromethorphan (PROMETHAZINE-DM) 6.25-15 MG/5ML syrup Take 5 mLs by mouth 4 (four) times daily as needed for cough.   No facility-administered encounter medications on file as of 12/13/2022.    Past Surgical History:  Procedure Laterality Date   APPENDECTOMY      ROS    Objective    There were no vitals taken for this visit.  Physical Exam    Assessment & Plan:  There are no diagnoses linked to this encounter.  No follow-ups on file.   Cruzita Lederer Newman Nip, FNP

## 2022-12-12 NOTE — Patient Instructions (Signed)
        Great to see you today.  I have refilled the medication(s) we provide.      Diet Recommendations:  1. High-Fiber Diet Fiber helps soften stool, making it easier to pass and reducing pressure on the colon.  Goal: Aim for 25-30 grams of fiber daily.  Sources:  Fruits: Apples, pears, oranges, berries (with skin). Vegetables: Broccoli, spinach, carrots, zucchini, squash. Whole Grains: Oatmeal, whole-grain bread, quinoa, brown rice. Legumes: Lentils, chickpeas, black beans, and peas. 2. Adequate Hydration Drink at least 8-10 glasses of water daily to help fiber move through the digestive system smoothly. 3. Limit Low-Fiber Foods: Avoid refined grains, such as white bread, white rice, and processed snacks, as they lack fiber and can contribute to constipation. Foods to Be Cautious About: While there is no strong evidence that seeds, nuts, or popcorn worsen diverticulosis, some patients may choose to avoid them if they feel these foods irritate their symptoms.  Trial and Error: Monitor symptoms to determine if small seeds (e.g., from strawberries, tomatoes) or nuts trigger discomfort.  Lifestyle Tips: Increase Fiber Gradually: Adding too much fiber too quickly can cause gas and bloating. Increase intake slowly over a few weeks. Regular Meals: Eat smaller, well-balanced meals to maintain steady digestion. Exercise: Regular physical activity promotes bowel regularity. Avoid Straining: Straining during bowel movements can increase pressure on the colon.     If labs were collected, we will inform you of lab results once received either by echart message or telephone call.   - echart message- for normal results that have been seen by the patient already.   - telephone call: abnormal results or if patient has not viewed results in their echart.   - Please take medications as prescribed. - Follow up with your primary health provider if any health concerns arises. - If symptoms  worsen please contact your primary care provider and/or visit the emergency department.

## 2022-12-13 ENCOUNTER — Encounter: Payer: Self-pay | Admitting: Family Medicine

## 2022-12-13 ENCOUNTER — Ambulatory Visit: Payer: Medicaid Other | Admitting: Family Medicine

## 2022-12-13 VITALS — BP 116/72 | HR 74 | Ht 69.0 in | Wt 295.0 lb

## 2022-12-13 DIAGNOSIS — Z0100 Encounter for examination of eyes and vision without abnormal findings: Secondary | ICD-10-CM

## 2022-12-13 DIAGNOSIS — Z1159 Encounter for screening for other viral diseases: Secondary | ICD-10-CM

## 2022-12-13 DIAGNOSIS — Z23 Encounter for immunization: Secondary | ICD-10-CM | POA: Diagnosis not present

## 2022-12-13 DIAGNOSIS — E559 Vitamin D deficiency, unspecified: Secondary | ICD-10-CM

## 2022-12-13 DIAGNOSIS — R7301 Impaired fasting glucose: Secondary | ICD-10-CM | POA: Diagnosis not present

## 2022-12-13 DIAGNOSIS — Z0001 Encounter for general adult medical examination with abnormal findings: Secondary | ICD-10-CM | POA: Insufficient documentation

## 2022-12-13 DIAGNOSIS — Z114 Encounter for screening for human immunodeficiency virus [HIV]: Secondary | ICD-10-CM

## 2022-12-13 DIAGNOSIS — Z8659 Personal history of other mental and behavioral disorders: Secondary | ICD-10-CM

## 2022-12-13 DIAGNOSIS — E038 Other specified hypothyroidism: Secondary | ICD-10-CM

## 2022-12-13 DIAGNOSIS — Z136 Encounter for screening for cardiovascular disorders: Secondary | ICD-10-CM

## 2022-12-13 MED ORDER — ALBUTEROL SULFATE HFA 108 (90 BASE) MCG/ACT IN AERS: 2.0000 | INHALATION_SPRAY | Freq: Four times a day (QID) | RESPIRATORY_TRACT | 2 refills | Status: DC | PRN

## 2022-12-13 MED ORDER — OLANZAPINE 2.5 MG PO TABS
2.5000 mg | ORAL_TABLET | Freq: Two times a day (BID) | ORAL | 3 refills | Status: DC
Start: 2022-12-13 — End: 2023-05-22

## 2022-12-13 NOTE — Progress Notes (Signed)
Complete physical exam  Patient: Gene Berry   DOB: [birth year]   21 y.o. Male  MRN: [id number]  Subjective:    Chief Complaint  Patient presents with   Establish Care    Would like to restart bipolar meds.     Gene Berry is a 22 y.o. male who presents today for a complete physical exam. He reports consuming a  high fat   diet. The patient has a physically strenuous job, but has no regular exercise apart from work.  He generally feels "alright". He reports sleeping poorly 4-5 hours per night. He does have additional problems to discuss today.    Most recent fall risk assessment:    12/13/2022    2:30 PM  Fall Risk   Falls in the past year? 1  Number falls in past yr: 0  Injury with Fall? 1  Risk for fall due to : No Fall Risks  Follow up Falls evaluation completed     Most recent depression screenings:    12/13/2022    2:30 PM  PHQ 2/9 Scores  PHQ - 2 Score 1  PHQ- 9 Score 8    Vision:Not within last year  and Dental: No current dental problems and Receives regular dental care  Patient Care Team: Del Newman Nip, Tenna Child, FNP as PCP - General (Family Medicine) Mallipeddi, Orion Modest, MD as PCP - Cardiology (Cardiology)   Outpatient Medications Prior to Visit  Medication Sig   pantoprazole (PROTONIX) 40 MG tablet Take 1 tablet (40 mg total) by mouth daily.   fluticasone (FLONASE) 50 MCG/ACT nasal spray Place 2 sprays into both nostrils daily. (Patient not taking: Reported on 12/13/2022)   ibuprofen (ADVIL) 600 MG tablet Take 1 tablet (600 mg total) by mouth every 8 (eight) hours as needed. (Patient not taking: Reported on 12/13/2022)   ondansetron (ZOFRAN) 4 MG tablet Take 1 tablet (4 mg total) by mouth every 6 (six) hours. As needed for nausea vomiting (Patient not taking: Reported on 12/13/2022)   promethazine-dextromethorphan (PROMETHAZINE-DM) 6.25-15 MG/5ML syrup Take 5 mLs by mouth 4 (four) times daily as needed for cough. (Patient not taking: Reported on  12/13/2022)   [DISCONTINUED] albuterol (VENTOLIN HFA) 108 (90 Base) MCG/ACT inhaler Inhale 1-2 puffs into the lungs every 4 (four) hours as needed for wheezing or shortness of breath. (Patient not taking: Reported on 12/13/2022)   No facility-administered medications prior to visit.    Review of Systems  Constitutional:  Negative for fever.  Eyes:  Negative for blurred vision.  Respiratory:  Negative for shortness of breath.   Cardiovascular:  Negative for chest pain.  Gastrointestinal:  Positive for diarrhea. Negative for nausea and vomiting.  Genitourinary:  Negative for dysuria.  Musculoskeletal:  Negative for myalgias.  Skin:  Negative for rash.  Neurological:  Negative for dizziness and headaches.       Objective:    BP 116/72   Pulse 74   Ht 5\' 9"  (1.753 m)   Wt 295 lb (133.8 kg)   SpO2 96%   BMI 43.56 kg/m  BP Readings from Last 3 Encounters:  12/13/22 116/72  10/06/22 121/72  07/20/22 123/80      Physical Exam Vitals reviewed.  Constitutional:      General: He is not in acute distress.    Appearance: Normal appearance. He is not ill-appearing, toxic-appearing or diaphoretic.  HENT:     Head: Normocephalic.  Eyes:     General:        Right  eye: No discharge.        Left eye: No discharge.     Conjunctiva/sclera: Conjunctivae normal.  Cardiovascular:     Rate and Rhythm: Normal rate.     Pulses: Normal pulses.     Heart sounds: Normal heart sounds.  Pulmonary:     Effort: Pulmonary effort is normal. No respiratory distress.     Breath sounds: Normal breath sounds.  Abdominal:     General: Bowel sounds are normal.     Palpations: Abdomen is soft.     Tenderness: There is no abdominal tenderness. There is no right CVA tenderness, left CVA tenderness or guarding.  Musculoskeletal:        General: Normal range of motion.     Cervical back: Normal range of motion.  Skin:    General: Skin is warm and dry.     Capillary Refill: Capillary refill takes less  than 2 seconds.  Neurological:     Mental Status: He is alert.     Coordination: Coordination normal.     Gait: Gait normal.  Psychiatric:        Mood and Affect: Mood normal.        Behavior: Behavior normal.      No results found for any visits on 12/13/22.    Assessment & Plan:    Routine Health Maintenance and Physical Exam  Immunization History  Administered Date(s) Administered   Influenza-Unspecified 04/22/2021   Tdap 12/13/2022    Health Maintenance  Topic Date Due   HPV VACCINES (1 - Male 3-dose series) Never done   HIV Screening  Never done   Hepatitis C Screening  Never done   COVID-19 Vaccine (1 - 2023-24 season) 12/29/2022 (Originally 09/24/2022)   INFLUENZA VACCINE  04/23/2023 (Originally 08/24/2022)   DTaP/Tdap/Td (2 - Td or Tdap) 12/12/2032    Discussed health benefits of physical activity, and encouraged him to engage in regular exercise appropriate for his age and condition.  Encounter for screening for cardiovascular disorders -     Lipid panel -     CMP14+EGFR -     CBC with Differential/Platelet  Need for hepatitis C screening test -     Hepatitis C antibody  Vitamin D deficiency -     VITAMIN D 25 Hydroxy (Vit-D Deficiency, Fractures)  Screening for HIV (human immunodeficiency virus) -     HIV Antibody (routine testing w rflx)  TSH (thyroid-stimulating hormone deficiency) -     TSH + free T4  IFG (impaired fasting glucose) -     Hemoglobin A1c  Encounter for immunization -     Tdap vaccine greater than or equal to 7yo IM  Hx of bipolar disorder Assessment & Plan: Patient expressed a desire to restart Zyprexa, a medication they were previously prescribed.  Zyprexa 2.5 mg twice daily has been refilled, and a referral to psychiatry has been placed for further evaluation and management. Advise  patient  to maintain a consistent daily routine, including regular sleep, meals, and exercise, which can help stabilize mood. Keeping a mood journal  to track emotional patterns and identify potential triggers is also recommended. Additionally, avoiding alcohol and recreational drugs and adhering to the prescribed medication regimen are essential for managing symptoms effectively. The patient is advised to seek support promptly if symptoms worsen.  Orders: -     Ambulatory referral to Psychiatry  Routine eye exam -     Ambulatory referral to Ophthalmology  Encounter for routine adult physical exam  with abnormal findings Assessment & Plan: A comprehensive physical examination was completed, and necessary labs were ordered. Screening and health maintenance recommendations have been updated. The patient received counseling on exercise and nutrition. BMI was assessed and discussed Advise for heart health, focus on: Eat more fruits and vegetables: Aim for a variety of colors. Choose whole grains: Brown rice, oats, and whole-wheat bread. Limit unhealthy fats: Avoid trans fats; use olive or avocado oil instead. Include lean proteins: Opt for fish, chicken, beans, and legumes. Reduce sodium: Limit processed foods and add less salt. Stay hydrated: Drink plenty of water. Exercise regularly: Aim for at least 30 minutes of moderate exercise, like walking or cycling, 5 days a week.     Other orders -     OLANZapine; Take 1 tablet (2.5 mg total) by mouth 2 (two) times daily.  Dispense: 60 tablet; Refill: 3 -     Albuterol Sulfate HFA; Inhale 2 puffs into the lungs every 6 (six) hours as needed for wheezing or shortness of breath.  Dispense: 8 g; Refill: 2    Return in about 1 year (around 12/13/2023), or chronic diarrhea schedule an appointment when you can to over this symptom.     Cruzita Lederer Newman Nip, FNP

## 2022-12-13 NOTE — Assessment & Plan Note (Signed)
Patient expressed a desire to restart Zyprexa, a medication they were previously prescribed.  Zyprexa 2.5 mg twice daily has been refilled, and a referral to psychiatry has been placed for further evaluation and management. Advise  patient  to maintain a consistent daily routine, including regular sleep, meals, and exercise, which can help stabilize mood. Keeping a mood journal to track emotional patterns and identify potential triggers is also recommended. Additionally, avoiding alcohol and recreational drugs and adhering to the prescribed medication regimen are essential for managing symptoms effectively. The patient is advised to seek support promptly if symptoms worsen.

## 2022-12-13 NOTE — Assessment & Plan Note (Signed)

## 2022-12-21 LAB — CMP14+EGFR
ALT: 21 [IU]/L (ref 0–44)
AST: 18 [IU]/L (ref 0–40)
Albumin: 4.3 g/dL (ref 4.3–5.2)
Alkaline Phosphatase: 89 [IU]/L (ref 44–121)
BUN/Creatinine Ratio: 12 (ref 9–20)
BUN: 11 mg/dL (ref 6–20)
Bilirubin Total: 0.5 mg/dL (ref 0.0–1.2)
CO2: 21 mmol/L (ref 20–29)
Calcium: 9.5 mg/dL (ref 8.7–10.2)
Chloride: 106 mmol/L (ref 96–106)
Creatinine, Ser: 0.91 mg/dL (ref 0.76–1.27)
Globulin, Total: 2.7 g/dL (ref 1.5–4.5)
Glucose: 86 mg/dL (ref 70–99)
Potassium: 4.2 mmol/L (ref 3.5–5.2)
Sodium: 141 mmol/L (ref 134–144)
Total Protein: 7 g/dL (ref 6.0–8.5)
eGFR: 122 mL/min/{1.73_m2} (ref >59)

## 2022-12-21 LAB — CBC WITH DIFFERENTIAL/PLATELET
Basophils Absolute: 0.1 10*3/uL (ref 0.0–0.2)
Basos: 1 %
EOS (ABSOLUTE): 0.2 10*3/uL (ref 0.0–0.4)
Eos: 2 %
Hematocrit: 47 % (ref 37.5–51.0)
Hemoglobin: 15 g/dL (ref 13.0–17.7)
Immature Grans (Abs): 0 10*3/uL (ref 0.0–0.1)
Immature Granulocytes: 0 %
Lymphocytes Absolute: 3.4 10*3/uL — ABNORMAL HIGH (ref 0.7–3.1)
Lymphs: 39 %
MCH: 28.8 pg (ref 26.6–33.0)
MCHC: 31.9 g/dL (ref 31.5–35.7)
MCV: 90 fL (ref 79–97)
Monocytes Absolute: 0.6 10*3/uL (ref 0.1–0.9)
Monocytes: 7 %
Neutrophils Absolute: 4.5 10*3/uL (ref 1.4–7.0)
Neutrophils: 51 %
Platelets: 285 10*3/uL (ref 150–450)
RBC: 5.21 x10E6/uL (ref 4.14–5.80)
RDW: 12.7 % (ref 11.6–15.4)
WBC: 8.8 10*3/uL (ref 3.4–10.8)

## 2022-12-21 LAB — TSH+FREE T4
Free T4: 1.29 ng/dL (ref 0.82–1.77)
TSH: 1.48 u[IU]/mL (ref 0.450–4.500)

## 2022-12-21 LAB — HEMOGLOBIN A1C
Est. average glucose Bld gHb Est-mCnc: 108 mg/dL
Hgb A1c MFr Bld: 5.4 % (ref 4.8–5.6)

## 2022-12-21 LAB — LIPID PANEL
Chol/HDL Ratio: 7.1 {ratio} — ABNORMAL HIGH (ref 0.0–5.0)
Cholesterol, Total: 221 mg/dL — ABNORMAL HIGH (ref 100–199)
HDL: 31 mg/dL — ABNORMAL LOW (ref >39)
LDL Chol Calc (NIH): 158 mg/dL — ABNORMAL HIGH (ref 0–99)
Triglycerides: 172 mg/dL — ABNORMAL HIGH (ref 0–149)
VLDL Cholesterol Cal: 32 mg/dL (ref 5–40)

## 2022-12-21 LAB — HEPATITIS C ANTIBODY: Hep C Virus Ab: NONREACTIVE

## 2022-12-21 LAB — HIV ANTIBODY (ROUTINE TESTING W REFLEX): HIV Screen 4th Generation wRfx: NONREACTIVE

## 2022-12-21 LAB — VITAMIN D 25 HYDROXY (VIT D DEFICIENCY, FRACTURES): Vit D, 25-Hydroxy: 17.8 ng/mL — ABNORMAL LOW (ref 30.0–100.0)

## 2023-01-08 ENCOUNTER — Telehealth: Payer: Self-pay | Admitting: Family Medicine

## 2023-01-08 NOTE — Telephone Encounter (Signed)
Copied from CRM [id number]. Topic: Referral - Question >> Jan 08, 2023  3:39 PM Hector Shade B wrote:  reason for CRM: Patient stated the psychiatrist that he was referred to will not answer calls then stated the calls just goes to voice mail, without any return calls.  Patient needs to know if there's any other providers that he can be referred to.  He states that he doesn't want to run out of medications (bipolar) meds before being seen.

## 2023-04-02 ENCOUNTER — Telehealth: Payer: Self-pay | Admitting: Pharmacy Technician

## 2023-04-02 ENCOUNTER — Other Ambulatory Visit (HOSPITAL_COMMUNITY): Payer: Self-pay

## 2023-04-02 NOTE — Telephone Encounter (Signed)
 Pharmacy Patient Advocate Encounter   Received notification from Patient Pharmacy that prior authorization for OLANZAPINE 2.5MG  TABLETS is required/requested.   Insurance verification completed.   The patient is insured through [facility] .   Per test claim: PA required; PA submitted to above mentioned insurance via CoverMyMeds Key/confirmation #/[id number] [facility] Status is pending

## 2023-04-03 ENCOUNTER — Other Ambulatory Visit (HOSPITAL_COMMUNITY): Payer: Self-pay

## 2023-04-03 NOTE — Telephone Encounter (Signed)
 Pharmacy Patient Advocate Encounter  Received notification from [facility] that Prior Authorization for OLANZAPINE 2.5MG  TABLETS has been APPROVED from 04/03/2023 to 04/02/2024. Ran test claim, Copay is $4.00. This test claim was processed through [facility]- copay amounts may vary at other pharmacies due to pharmacy/plan contracts, or as the patient moves through the different stages of their insurance plan.   PA #/Case ID/Reference #: [id number]

## 2023-05-22 ENCOUNTER — Ambulatory Visit (INDEPENDENT_AMBULATORY_CARE_PROVIDER_SITE_OTHER): Payer: Self-pay | Admitting: Internal Medicine

## 2023-05-22 ENCOUNTER — Encounter: Payer: Self-pay | Admitting: Internal Medicine

## 2023-05-22 VITALS — BP 131/78 | HR 100 | Ht 69.0 in | Wt 291.4 lb

## 2023-05-22 DIAGNOSIS — K529 Noninfective gastroenteritis and colitis, unspecified: Secondary | ICD-10-CM | POA: Diagnosis not present

## 2023-05-22 DIAGNOSIS — R197 Diarrhea, unspecified: Secondary | ICD-10-CM | POA: Insufficient documentation

## 2023-05-22 DIAGNOSIS — K219 Gastro-esophageal reflux disease without esophagitis: Secondary | ICD-10-CM

## 2023-05-22 DIAGNOSIS — R638 Other symptoms and signs concerning food and fluid intake: Secondary | ICD-10-CM

## 2023-05-22 MED ORDER — PANTOPRAZOLE SODIUM 40 MG PO TBEC
40.0000 mg | DELAYED_RELEASE_TABLET | Freq: Every day | ORAL | 3 refills | Status: DC
Start: 2023-05-22 — End: 2023-07-17

## 2023-05-22 NOTE — Assessment & Plan Note (Signed)
 Needs to take pantoprazole  40 mg QD regularly If persistent symptoms, can take Pepcid as needed Avoid hot and spicy food

## 2023-05-22 NOTE — Progress Notes (Signed)
 Acute Office Visit  Subjective:    Patient ID: Gene Berry, male    DOB: 2000/12/19, 23 y.o.   MRN: [id number]  Chief Complaint  Patient presents with   Urinary Retention    Pt reports sx of urinating less, and has lost weight suddenly.     HPI Patient is in today for complaint of decreased volume of urine output for the last 1 week.  He also reports about 34 pounds weight loss in the last 3 months.  Later, he reports that he has been having watery diarrhea, but denies any melena or hematochezia.  He feels fatigued and subjective fever with chills.  Denies any recent travel or unusual food intake.  He has been having mild nausea and an episode of vomiting about a week ago.  He has lack of appetite and decreased desire in eating for the last 3 months.  He initially attributed his symptoms to buspirone, but later mentioned that his symptoms have been present even before starting buspirone.  Denies chronic cough, hemoptysis, LAD, night sweats, urethral discharge or skin rash.  Past Medical History:  Diagnosis Date   ADHD    Bipolar affective disorder (HCC)    Heart murmur    SVT (supraventricular tachycardia) (HCC)     Past Surgical History:  Procedure Laterality Date   APPENDECTOMY      Family History  Problem Relation Age of Onset   Anxiety disorder Mother    Hypertension Father    Cancer - Other Father    Heart attack Maternal Grandmother    Heart attack Maternal Grandfather    Cirrhosis Maternal Grandfather    Stroke Maternal Grandfather    Heart attack Paternal Grandmother    Heart attack Paternal Grandfather     Social History   Socioeconomic History   Marital status: Significant Other    Spouse name: Mariano Shiver   Number of children: Not on file   Years of education: Not on file   Highest education level: GED or equivalent  Occupational History   Occupation: Chief Financial Officer    Comment: Web designer  Tobacco Use   Smoking status: Former    Current  packs/day: 0.50    Average packs/day: 1.6 packs/day for 5.3 years (8.7 ttl pk-yrs)    Types: Cigarettes    Start date: 10/27/2015    Quit date: 10/27/2019    Passive exposure: Never   Smokeless tobacco: Former    Types: Chew   Tobacco comments:    smokes 5-6 cigarettes per day, vapes all other times  Vaping Use   Vaping status: Every Day   Substances: THC, Flavoring, Nicotine-salt  Substance and Sexual Activity   Alcohol use: Yes    Alcohol/week: 1.0 standard drink of alcohol    Types: 1 Standard drinks or equivalent per week    Comment: occ   Drug use: Not Currently    Types: Marijuana    Comment: cbd gummies   Sexual activity: Yes    Birth control/protection: None  Other Topics Concern   Not on file  Social History Narrative   Not on file   Social Drivers of Health   Financial Resource Strain: Not on file  Food Insecurity: Not on file  Transportation Needs: Not on file  Physical Activity: Not on file  Stress: Not on file  Social Connections: Not on file  Intimate Partner Violence: Not on file    Outpatient Medications Prior to Visit  Medication Sig Dispense Refill   busPIRone (  BUSPAR) 10 MG tablet Take 10 mg by mouth 3 (three) times daily.     albuterol  (VENTOLIN  HFA) 108 (90 Base) MCG/ACT inhaler Inhale 2 puffs into the lungs every 6 (six) hours as needed for wheezing or shortness of breath. 8 g 2   fluticasone  (FLONASE ) 50 MCG/ACT nasal spray Place 2 sprays into both nostrils daily. 16 g 0   ibuprofen  (ADVIL ) 600 MG tablet Take 1 tablet (600 mg total) by mouth every 8 (eight) hours as needed. 30 tablet 0   OLANZapine  (ZYPREXA ) 2.5 MG tablet Take 1 tablet (2.5 mg total) by mouth 2 (two) times daily. 60 tablet 3   ondansetron  (ZOFRAN ) 4 MG tablet Take 1 tablet (4 mg total) by mouth every 6 (six) hours. As needed for nausea vomiting 12 tablet 0   pantoprazole  (PROTONIX ) 40 MG tablet Take 1 tablet (40 mg total) by mouth daily. 30 tablet 0   promethazine -dextromethorphan  (PROMETHAZINE -DM) 6.25-15 MG/5ML syrup Take 5 mLs by mouth 4 (four) times daily as needed for cough. 118 mL 0   No facility-administered medications prior to visit.    Allergies  Allergen Reactions   Augmentin [Amoxicillin-Pot Clavulanate] Anaphylaxis   Morphine Anaphylaxis    Review of Systems  Constitutional:  Positive for appetite change, fatigue and unexpected weight change. Negative for chills.  HENT:  Negative for congestion and sore throat.   Eyes:  Negative for pain and discharge.  Respiratory:  Negative for cough and shortness of breath.   Cardiovascular:  Negative for chest pain and palpitations.  Gastrointestinal:  Positive for abdominal pain, diarrhea, nausea and vomiting. Negative for constipation.  Endocrine: Negative for polydipsia and polyuria.  Genitourinary:  Negative for dysuria and hematuria.  Musculoskeletal:  Negative for neck pain and neck stiffness.  Skin:  Negative for rash.  Neurological:  Positive for weakness. Negative for dizziness.  Psychiatric/Behavioral:  Positive for sleep disturbance. Negative for agitation and behavioral problems. The patient is nervous/anxious.        Objective:    Physical Exam Vitals reviewed.  Constitutional:      General: He is not in acute distress.    Appearance: He is not diaphoretic.  HENT:     Head: Normocephalic and atraumatic.     Nose: Nose normal.     Mouth/Throat:     Mouth: Mucous membranes are moist.  Eyes:     General: No scleral icterus.    Extraocular Movements: Extraocular movements intact.  Cardiovascular:     Rate and Rhythm: Normal rate and regular rhythm.     Heart sounds: Normal heart sounds. No murmur heard. Pulmonary:     Breath sounds: Normal breath sounds. No wheezing or rales.  Abdominal:     General: Bowel sounds are normal.     Palpations: Abdomen is soft.     Tenderness: There is abdominal tenderness (Mild, epigastric and periumbilical). There is no guarding or rebound.   Musculoskeletal:     Cervical back: Neck supple. No tenderness.     Right lower leg: No edema.     Left lower leg: No edema.  Skin:    General: Skin is warm.     Findings: No rash.  Neurological:     General: No focal deficit present.     Mental Status: He is alert and oriented to person, place, and time.  Psychiatric:        Mood and Affect: Mood normal.        Behavior: Behavior normal.  BP 131/78   Pulse 100   Ht 5\' 9"  (1.753 m)   Wt 291 lb 6.4 oz (132.2 kg)   SpO2 98%   BMI 43.03 kg/m  Wt Readings from Last 3 Encounters:  05/22/23 291 lb 6.4 oz (132.2 kg)  12/13/22 295 lb (133.8 kg)  10/06/22 295 lb 6.7 oz (134 kg)        Assessment & Plan:   Problem List Items Addressed This Visit       Digestive   Chronic diarrhea - Primary   Watery diarrhea more than 6 weeks Check stool GI profile Check CBC, CMP and Mg Advised to maintain at least 64 ounces of fluid intake in a day and an additional 8 ounces for each episode of watery diarrhea Needs to slowly advance diet, continue pantoprazole  for GERD/gastritis Can hold buspirone for now as he believes his symptoms are worse with it If stool GI profile is negative, will refer to GI for further evaluation - has h/o bipolar disorder, IBS can be on differential diagnosis as well       Relevant Orders   GI Profile, Stool, PCR   CBC with Differential/Platelet   CMP14+EGFR   Magnesium   Gastroesophageal reflux disease   Needs to take pantoprazole  40 mg QD regularly If persistent symptoms, can take Pepcid as needed Avoid hot and spicy food      Relevant Medications   pantoprazole  (PROTONIX ) 40 MG tablet     Other   Dehydration symptoms   Fatigue, weight loss and decreased urine output likely due to dehydration Check CMP Needs to maintain at least 64 ounces of fluid intake and eat at regular intervals        Meds ordered this encounter  Medications   pantoprazole  (PROTONIX ) 40 MG tablet    Sig: Take 1  tablet (40 mg total) by mouth daily.    Dispense:  30 tablet    Refill:  3     Quron Ruddy Alyssa Backbone, MD

## 2023-05-22 NOTE — Assessment & Plan Note (Addendum)
 Watery diarrhea more than 6 weeks Check stool GI profile Check CBC, CMP and Mg Advised to maintain at least 64 ounces of fluid intake in a day and an additional 8 ounces for each episode of watery diarrhea Needs to slowly advance diet, continue pantoprazole  for GERD/gastritis Can hold buspirone for now as he believes his symptoms are worse with it If stool GI profile is negative, will refer to GI for further evaluation - has h/o bipolar disorder, IBS can be on differential diagnosis as well

## 2023-05-22 NOTE — Assessment & Plan Note (Signed)
 Fatigue, weight loss and decreased urine output likely due to dehydration Check CMP Needs to maintain at least 64 ounces of fluid intake and eat at regular intervals

## 2023-05-22 NOTE — Patient Instructions (Signed)
 Please maintain at least 64 ounces of fluid intake in a day.  Please get stool test done as ordered.

## 2023-05-23 LAB — CMP14+EGFR
ALT: 28 IU/L (ref 0–44)
AST: 21 IU/L (ref 0–40)
Albumin: 4.8 g/dL (ref 4.3–5.2)
Alkaline Phosphatase: 85 IU/L (ref 44–121)
BUN/Creatinine Ratio: 11 (ref 9–20)
BUN: 12 mg/dL (ref 6–20)
Bilirubin Total: 0.5 mg/dL (ref 0.0–1.2)
CO2: 18 mmol/L — ABNORMAL LOW (ref 20–29)
Calcium: 10.1 mg/dL (ref 8.7–10.2)
Chloride: 102 mmol/L (ref 96–106)
Creatinine, Ser: 1.08 mg/dL (ref 0.76–1.27)
Globulin, Total: 2.7 g/dL (ref 1.5–4.5)
Glucose: 86 mg/dL (ref 70–99)
Potassium: 4.4 mmol/L (ref 3.5–5.2)
Sodium: 139 mmol/L (ref 134–144)
Total Protein: 7.5 g/dL (ref 6.0–8.5)
eGFR: 99 mL/min/{1.73_m2} (ref >59)

## 2023-05-23 LAB — CBC WITH DIFFERENTIAL/PLATELET
Basophils Absolute: 0.1 10*3/uL (ref 0.0–0.2)
Basos: 1 %
EOS (ABSOLUTE): 0.1 10*3/uL (ref 0.0–0.4)
Eos: 2 %
Hematocrit: 47.4 % (ref 37.5–51.0)
Hemoglobin: 15.9 g/dL (ref 13.0–17.7)
Immature Grans (Abs): 0 10*3/uL (ref 0.0–0.1)
Immature Granulocytes: 0 %
Lymphocytes Absolute: 2.5 10*3/uL (ref 0.7–3.1)
Lymphs: 32 %
MCH: 29 pg (ref 26.6–33.0)
MCHC: 33.5 g/dL (ref 31.5–35.7)
MCV: 86 fL (ref 79–97)
Monocytes Absolute: 0.5 10*3/uL (ref 0.1–0.9)
Monocytes: 7 %
Neutrophils Absolute: 4.6 10*3/uL (ref 1.4–7.0)
Neutrophils: 58 %
Platelets: 347 10*3/uL (ref 150–450)
RBC: 5.49 x10E6/uL (ref 4.14–5.80)
RDW: 12.7 % (ref 11.6–15.4)
WBC: 7.9 10*3/uL (ref 3.4–10.8)

## 2023-05-23 LAB — MAGNESIUM: Magnesium: 2.2 mg/dL (ref 1.6–2.3)

## 2023-05-24 ENCOUNTER — Other Ambulatory Visit: Payer: Self-pay | Admitting: Internal Medicine

## 2023-05-24 DIAGNOSIS — K529 Noninfective gastroenteritis and colitis, unspecified: Secondary | ICD-10-CM

## 2023-05-24 LAB — GI PROFILE, STOOL, PCR

## 2023-05-28 ENCOUNTER — Encounter (INDEPENDENT_AMBULATORY_CARE_PROVIDER_SITE_OTHER): Payer: Self-pay | Admitting: *Deleted

## 2023-07-12 ENCOUNTER — Encounter (INDEPENDENT_AMBULATORY_CARE_PROVIDER_SITE_OTHER): Payer: Self-pay | Admitting: Gastroenterology

## 2023-07-12 ENCOUNTER — Ambulatory Visit (INDEPENDENT_AMBULATORY_CARE_PROVIDER_SITE_OTHER): Admitting: Gastroenterology

## 2023-07-12 VITALS — BP 127/83 | HR 89 | Temp 97.5°F | Ht 69.0 in | Wt 288.6 lb

## 2023-07-12 DIAGNOSIS — R634 Abnormal weight loss: Secondary | ICD-10-CM | POA: Insufficient documentation

## 2023-07-12 DIAGNOSIS — R197 Diarrhea, unspecified: Secondary | ICD-10-CM | POA: Diagnosis not present

## 2023-07-12 NOTE — Patient Instructions (Signed)
 We will check some labs and xray of your abdomen today to try and determine cause of your diarrhea As discussed if the above testing is negative, we will proceed with colonoscopy  Follow 3 months  It was a pleasure to see you today. I want to create trusting relationships with patients and provide genuine, compassionate, and quality care. I truly value your feedback! please be on the lookout for a survey regarding your visit with me today. I appreciate your input about our visit and your time in completing this!    Zamarion Longest L. Sharina Petre, MSN, APRN, AGNP-C Adult-Gerontology Nurse Practitioner [facility] at [facility]

## 2023-07-12 NOTE — Progress Notes (Signed)
 Referring Provider: Meldon Sport, MD Primary Care Physician:  Rosanna Comment, FNP Primary GI Physician: New (Dr. Sammi Crick)   Chief Complaint  Patient presents with   New Patient (Initial Visit)    Patient here today as a new patient consult due to issues with diarrhea. This started 4-5 months ago, he has three to four loose stools depending on what he eats. He has tried imodium in the past and says this was not helpful.    HPI:   Gene Berry is a 23 y.o. male with past medical history of ADHD, Bipolar, SVT   Patient presenting today for as a new patient for: Diarrhea   GI profile 05/22/23 negative, CMP, CBC, magnesium all WNL TSh 1.48, T4 1.29 in November 2024  Present;  Reports sudden onset of diarrhea, watery to loose, 3-4 stools per day about 4-5 months ago. Denies any changes to diet or new medications. Has some lower abdominal pain across mid to lower abdomen that improves after he goes to the bathroom. Has some fecal urgency. Notes that at work it is difficult because he sometimes has to run to the restroom frequently. He has tried imodium and metamucil without improvement. Tried probiotic drink with no improvement. Denies any black stools or blood in stools. Notes he has a lot of gas. No changes in appetite, notes he has lost from over 300 pounds down to 288 today. Has not changed his diet or done any new physical activity. Denies any history of constipation though states previously only having a BM maybe once per week, no straining or hard stools per patient. 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 Denies any new stressors or lifestyle changes.   NSAID use: occasionally takes ibuprofen    Social hx: vapes, no etoh  Fam hx: grandfather has alcoholic cirrhosis of the liver   CT A/P with contrast: 09/2022 scattered diverticula Last Colonoscopy: never  Last Endoscopy: never   [facility] Weights   07/12/23 0805  Weight: 288 lb 9.6 oz (130.9 kg)     Past Medical  History:  Diagnosis Date   ADHD    Bipolar affective disorder (HCC)    Heart murmur    SVT (supraventricular tachycardia) (HCC)     Past Surgical History:  Procedure Laterality Date   APPENDECTOMY      Current Outpatient Medications  Medication Sig Dispense Refill   busPIRone (BUSPAR) 10 MG tablet Take 10 mg by mouth 3 (three) times daily. (Patient not taking: Reported on 07/12/2023)     pantoprazole  (PROTONIX ) 40 MG tablet Take 1 tablet (40 mg total) by mouth daily. 30 tablet 3   No current facility-administered medications for this visit.    Allergies as of 07/12/2023 - Review Complete 07/12/2023  Allergen Reaction Noted   Augmentin [amoxicillin-pot clavulanate] Anaphylaxis 10/23/2018   Morphine Anaphylaxis 04/30/2022    Social History   Socioeconomic History   Marital status: Significant Other    Spouse name: Mariano Shiver   Number of children: Not on file   Years of education: Not on file   Highest education level: GED or equivalent  Occupational History   Occupation: Chief Financial Officer    Comment: Web designer  Tobacco Use   Smoking status: Former    Current packs/day: 0.50    Average packs/day: 1.6 packs/day for 5.5 years (8.7 ttl pk-yrs)    Types: Cigarettes    Start date: 10/27/2015    Quit date: 10/27/2019    Passive exposure: Never  Smokeless tobacco: Former    Types: Chew   Tobacco comments:    smokes 5-6 cigarettes per day, vapes all other times  Vaping Use   Vaping status: Every Day   Substances: THC, Flavoring, Nicotine-salt  Substance and Sexual Activity   Alcohol use: Yes    Alcohol/week: 1.0 standard drink of alcohol    Types: 1 Standard drinks or equivalent per week    Comment: occ   Drug use: Not Currently    Types: Marijuana    Comment: cbd gummies   Sexual activity: Yes    Birth control/protection: None  Other Topics Concern   Not on file  Social History Narrative   Not on file   Social Drivers of Health   Financial Resource Strain:  Not on file  Food Insecurity: Not on file  Transportation Needs: Not on file  Physical Activity: Not on file  Stress: Not on file  Social Connections: Not on file    Review of systems General: negative for malaise, night sweats, fever, chills Neck: Negative for lumps, goiter, pain and significant neck swelling Resp: Negative for cough, wheezing, dyspnea at rest CV: Negative for chest pain, leg swelling, palpitations, orthopnea GI: denies melena, hematochezia, nausea, vomiting,constipation, dysphagia, odyonophagia, early satiety +diarrhea +weight loss +lower abdominal pain  MSK: Negative for joint pain or swelling, back pain, and muscle pain. Derm: Negative for itching or rash Psych: Denies depression, anxiety, memory loss, confusion. No homicidal or suicidal ideation.  Heme: Negative for prolonged bleeding, bruising easily, and swollen nodes. Endocrine: Negative for cold or heat intolerance, polyuria, polydipsia and goiter. Neuro: negative for tremor, gait imbalance, syncope and seizures. The remainder of the review of systems is noncontributory.  Physical Exam: BP 127/83 (BP Location: Left Arm, Patient Position: Sitting, Cuff Size: Large)   Pulse 89   Temp (!) 97.5 F (36.4 C) (Temporal)   Ht 5' 9 (1.753 m)   Wt 288 lb 9.6 oz (130.9 kg)   BMI 42.62 kg/m  General:   Alert and oriented. No distress noted. Pleasant and cooperative.  Head:  Normocephalic and atraumatic. Eyes:  Conjuctiva clear without scleral icterus. Mouth:  Oral mucosa pink and moist. Good dentition. No lesions. Heart: Normal rate and rhythm, s1 and s2 heart sounds present.  Lungs: Clear lung sounds in all lobes. Respirations equal and unlabored. Abdomen:  +BS, soft, non-tender and non-distended. No rebound or guarding. No HSM or masses noted. Derm: No palmar erythema or jaundice Msk:  Symmetrical without gross deformities. Normal posture. Extremities:  Without edema. Neurologic:  Alert and  oriented  x4 Psych:  Alert and cooperative. Normal mood and affect.  Invalid input(s): 6 MONTHS   ASSESSMENT: Gene Berry is a 23 y.o. male presenting today for diarrhea  Diarrhea for the past 4-5 months, sudden onset with some mid to lower abdominal pain. Reports no changes in diet,lifestyle or medications prior to onset. CMP unremarkable, GI stool panel negative, denies any antibiotics therefore low suspicion for C diff infection. He has no rectal bleeding or melena, does report some weight loss with no precipitating changes. His thyroid  function was normal at the end of 2024. At baseline having 1 BM per week, therefore this could be overflow diarrhea, in setting of weight loss cannot rule out malabsorption, celiac disease. Will obtain abdominal xray today to rule out overflow and check celiac panel, fecal fat, pancreatic elastase. If these investigations are unrevealing, will need to proceed with colonoscopy which I discussed with the patient. Indications, risks and  benefits of procedure discussed in detail with patient. Patient verbalized understanding and is in agreement to proceed with Colonoscopy if the above investigations are negative.    PLAN:  -fecal fat, pancreatic elastase, celiac panel -abd xray today -schedule colonoscopy if the above testing is negative  All questions were answered, patient verbalized understanding and is in agreement with plan as outlined above.   Follow Up: 3 months   Michaela Shankel L. Adrien Alberta, MSN, APRN, AGNP-C Adult-Gerontology Nurse Practitioner [facility]  I have reviewed the note and agree with the APP's assessment as described in this progress note  Samantha Cress, MD Gastroenterology and Hepatology [facility]

## 2023-07-14 LAB — CELIAC DISEASE PANEL
(tTG) Ab, IgA: <1 U/mL
(tTG) Ab, IgG: <1 U/mL
Gliadin IgA: <1 U/mL
Gliadin IgG: <1 U/mL
Immunoglobulin A: 387 mg/dL — ABNORMAL HIGH (ref 47–310)

## 2023-07-17 ENCOUNTER — Emergency Department (HOSPITAL_COMMUNITY)
Admission: EM | Admit: 2023-07-17 | Discharge: 2023-07-17 | Disposition: A | Discharge status: Home / Self Care | Attending: Emergency Medicine | Admitting: Emergency Medicine

## 2023-07-17 ENCOUNTER — Other Ambulatory Visit: Payer: Self-pay

## 2023-07-17 ENCOUNTER — Emergency Department (HOSPITAL_COMMUNITY)

## 2023-07-17 ENCOUNTER — Encounter (HOSPITAL_COMMUNITY): Payer: Self-pay

## 2023-07-17 ENCOUNTER — Ambulatory Visit: Payer: Self-pay

## 2023-07-17 DIAGNOSIS — R079 Chest pain, unspecified: Secondary | ICD-10-CM | POA: Diagnosis present

## 2023-07-17 LAB — CBC WITH DIFFERENTIAL/PLATELET
Abs Immature Granulocytes: 0.03 10*3/uL (ref 0.00–0.07)
Basophils Absolute: 0.1 10*3/uL (ref 0.0–0.1)
Basophils Relative: 1 %
Eosinophils Absolute: 0.2 10*3/uL (ref 0.0–0.5)
Eosinophils Relative: 2 %
HCT: 44.4 % (ref 39.0–52.0)
Hemoglobin: 15.3 g/dL (ref 13.0–17.0)
Immature Granulocytes: 0 %
Lymphocytes Relative: 31 %
Lymphs Abs: 2.5 10*3/uL (ref 0.7–4.0)
MCH: 29.9 pg (ref 26.0–34.0)
MCHC: 34.5 g/dL (ref 30.0–36.0)
MCV: 86.7 fL (ref 80.0–100.0)
Monocytes Absolute: 0.6 10*3/uL (ref 0.1–1.0)
Monocytes Relative: 7 %
Neutro Abs: 4.7 10*3/uL (ref 1.7–7.7)
Neutrophils Relative %: 59 %
Platelets: 288 10*3/uL (ref 150–400)
RBC: 5.12 MIL/uL (ref 4.22–5.81)
RDW: 12.3 % (ref 11.5–15.5)
WBC: 8 10*3/uL (ref 4.0–10.5)
nRBC: 0 % (ref 0.0–0.2)

## 2023-07-17 LAB — COMPREHENSIVE METABOLIC PANEL WITH GFR
ALT: 31 U/L (ref 0–44)
AST: 22 U/L (ref 15–41)
Albumin: 4.1 g/dL (ref 3.5–5.0)
Alkaline Phosphatase: 63 U/L (ref 38–126)
Anion gap: 10 (ref 5–15)
BUN: 14 mg/dL (ref 6–20)
CO2: 20 mmol/L — ABNORMAL LOW (ref 22–32)
Calcium: 9 mg/dL (ref 8.9–10.3)
Chloride: 107 mmol/L (ref 98–111)
Creatinine, Ser: 0.86 mg/dL (ref 0.61–1.24)
GFR, Estimated: >60 mL/min (ref >60)
Glucose, Bld: 116 mg/dL — ABNORMAL HIGH (ref 70–99)
Potassium: 3.6 mmol/L (ref 3.5–5.1)
Sodium: 137 mmol/L (ref 135–145)
Total Bilirubin: 0.6 mg/dL (ref 0.0–1.2)
Total Protein: 7.8 g/dL (ref 6.5–8.1)

## 2023-07-17 LAB — CK: Total CK: 145 U/L (ref 49–397)

## 2023-07-17 LAB — TROPONIN I (HIGH SENSITIVITY)
Troponin I (High Sensitivity): 2 ng/L (ref <18)
Troponin I (High Sensitivity): <2 ng/L (ref <18)

## 2023-07-17 LAB — D-DIMER, QUANTITATIVE: D-Dimer, Quant: 0.36 ug{FEU}/mL (ref 0.00–0.50)

## 2023-07-17 MED ORDER — SODIUM CHLORIDE 0.9 % IV BOLUS
1000.0000 mL | Freq: Once | INTRAVENOUS | Status: AC
  Administered 2023-07-17: 1000 mL via INTRAVENOUS

## 2023-07-17 NOTE — Discharge Instructions (Signed)
 Drink plenty of water over the next several days.  Someone from Dr. Ranae office will contact you to arrange follow-up appointment if you do not hear from them this week, call his office to arrange follow-up to the ER for any new or worsening symptoms.

## 2023-07-17 NOTE — Telephone Encounter (Signed)
 ED or Office visit

## 2023-07-17 NOTE — Telephone Encounter (Signed)
  FYI Only or Action Required?: FYI only for provider.  Patient was last seen in primary care on 05/22/2023 by Tobie Suzzane POUR, MD. Called Nurse Triage reporting Chest Pain. Symptoms began yesterday. Interventions attempted: Other: Fluids. Symptoms are: unchanged.  Triage Disposition: Home Care  Patient/caregiver understands and will follow disposition?: Yes   **Pt. Agrees to be seen in ED/UC for symptoms, as the soonest avail. Appt. Is in a few days                       Copied from CRM [id number]. Topic: Clinical - Red Word Triage >> Jul 17, 2023 10:51 AM [name] wrote: Red Word that prompted transfer to Nurse Triage: 178/70 last reading of blood pressure, last night it was over 200 for both. Unsure of what to do, please advise. Headache and some chest pains. Reason for Disposition  Chest pain(s) lasting a few seconds  Answer Assessment - Initial Assessment Questions 1. LOCATION: Where does it hurt?       Under left side of chest 2. RADIATION: Does the pain go anywhere else? (e.g., into neck, jaw, arms, back)     No, travels across chest   3. ONSET: When did the chest pain begin? (Minutes, hours or days)      X 1 day  4. PATTERN: Does the pain come and go, or has it been constant since it started?  Does it get worse with exertion?      Intermittent   5. DURATION: How long does it last (e.g., seconds, minutes, hours)     30-45 seconds   6. SEVERITY: How bad is the pain?  (e.g., Scale 1-10; mild, moderate, or severe)    - MILD (1-3): doesn't interfere with normal activities     - MODERATE (4-7): interferes with normal activities or awakens from sleep    - SEVERE (8-10): excruciating pain, unable to do any normal activities       3/10   7. CARDIAC RISK FACTORS: Do you have any history of heart problems or risk factors for heart disease? (e.g., angina, prior heart attack; diabetes, high blood pressure, high cholesterol, smoker, or strong family  history of heart disease)     Sees cardiologist, for palpitations, murmers   8. PULMONARY RISK FACTORS: Do you have any history of lung disease?  (e.g., blood clots in lung, asthma, emphysema, birth control pills)     No   9. CAUSE: What do you think is causing the chest pain?     Believes heat from heat wave/ working outside    10. OTHER SYMPTOMS: Do you have any other symptoms? (e.g., dizziness, nausea, vomiting, sweating, fever, difficulty breathing, cough)      Headache, Chest pain    Reports working outside, he stayed hydrated, but felt sweaty upon standing. He was seen by EMT on work site. He reports headache, no dx. Of HTN.  Protocols used: Chest Pain-A-AH

## 2023-07-17 NOTE — ED Triage Notes (Signed)
 Pt arrived via POV c/o recurrent hypertension and chest pain that radiates to his neck. Pt reports working 12hr shifts in a hot environment. Pt endorses previous hypertensive episodes and family Hx of heart problems.

## 2023-07-17 NOTE — ED Provider Notes (Signed)
 [place] EMERGENCY DEPARTMENT AT [facility] Provider Note   CSN: [id number] Arrival date & time: 07/17/23  1324     Patient presents with: Chest Pain   Randolf Sansoucie is a 23 y.o. male.  {Add pertinent medical, surgical, social history, OB history to HPI:32947}  Chest Pain Associated symptoms: no abdominal pain, no cough, no dizziness, no fever, no headache, no nausea, no numbness, no shortness of breath, no vomiting and no weakness         Maverik Foot is a 23 y.o. male past medical history of generalized anxiety disorder, reflux, SVT, ADHD and heart murmur who presents to the Emergency Department complaining of sudden onset of stabbing type left-sided chest pain that radiated into the left side of his neck and back of his head.  Episode occurred yesterday.  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  States he had been drinking water.  He denies any shortness of breath arm pain, jaw pain numbness or weakness.  No nausea or vomiting.  States his blood pressure was elevated at that time.  Does not currently take any medications for his blood pressure, does see a local cardiology.    Prior to Admission medications   Not on File    Allergies: Augmentin [amoxicillin-pot clavulanate] and Morphine    Review of Systems  Constitutional:  Negative for appetite change, chills and fever.  Respiratory:  Negative for cough and shortness of breath.   Cardiovascular:  Positive for chest pain.  Gastrointestinal:  Negative for abdominal pain, diarrhea, nausea and vomiting.  Neurological:  Negative for dizziness, syncope, weakness, numbness and headaches.    Updated Vital Signs BP (!) 161/85 (BP Location: Right Arm)   Pulse 94   Temp 97.9 F (36.6 C)   Resp (!) 22   Ht 5' 9 (1.753 m)   Wt 130.6 kg   SpO2 99%   BMI 42.53 kg/m   Physical Exam Vitals and nursing note reviewed.  Constitutional:      General: He is not in acute  distress.    Appearance: Normal appearance. He is not ill-appearing or toxic-appearing.  HENT:     Mouth/Throat:     Mouth: Mucous membranes are moist.   Eyes:     Extraocular Movements: Extraocular movements intact.     Pupils: Pupils are equal, round, and reactive to light.    Cardiovascular:     Rate and Rhythm: Normal rate and regular rhythm.     Pulses: Normal pulses.  Pulmonary:     Effort: Pulmonary effort is normal.  Abdominal:     Palpations: Abdomen is soft.     Tenderness: There is no abdominal tenderness.   Musculoskeletal:        General: Normal range of motion.   Skin:    General: Skin is warm.     Capillary Refill: Capillary refill takes less than 2 seconds.   Neurological:     General: No focal deficit present.     Mental Status: He is alert.     Sensory: No sensory deficit.     Motor: No weakness.    (all labs ordered are listed, but only abnormal results are displayed) Labs Reviewed  COMPREHENSIVE METABOLIC PANEL WITH GFR - Abnormal; Notable for the following components:      Result Value   CO2 20 (*)    Glucose, Bld 116 (*)    All other components within normal limits  CBC WITH DIFFERENTIAL/PLATELET  CK  D-DIMER, QUANTITATIVE  TROPONIN I (HIGH SENSITIVITY)  TROPONIN I (HIGH SENSITIVITY)    EKG: None  Radiology: DG Chest 2 View Result Date: 07/17/2023 CLINICAL DATA:  Hypertension, chest pain radiating to neck EXAM: CHEST - 2 VIEW COMPARISON:  08/27/2021 FINDINGS: The heart size and mediastinal contours are within normal limits. Both lungs are clear. The visualized skeletal structures are unremarkable. IMPRESSION: No active cardiopulmonary disease. Electronically Signed   By: Ozell Daring M.D.   On: 07/17/2023 14:29    {Document cardiac monitor, telemetry assessment procedure when appropriate:32947} Procedures   Medications Ordered in the ED  sodium chloride  0.9 % bolus 1,000 mL (has no administration in time range)      {Click here  for ABCD2, HEART and other calculators REFRESH Note before signing:1}                              Medical Decision Making Pt here with chest pain after working in the heat and had chest pain after reaching to grab a pallet.  Symptoms occurred yesterday.  He is concerned about his blood pressure being elevated as well.  No hx of HTN.    He denies myalgias, cramps, vomiting   Sx's felt to be heat related, possible rhabdo, PE, MSK, anxiety, AKI,  ACS considered but felt less likely  Amount and/or Complexity of Data Reviewed Labs: ordered.    Details: Labs reassuring.  Delta troponin unchanged, total CK unremarkable, D-dimer reassuring Radiology: ordered.    Details: Chest x-ray without acute finding ECG/medicine tests: ordered.    Details: Sinus rhythm With sinus arrhythmia     {Document critical care time when appropriate  Document review of labs and clinical decision tools ie CHADS2VASC2, etc  Document your independent review of radiology images and any outside records  Document your discussion with family members, caretakers and with consultants  Document social determinants of health affecting pt's care  Document your decision making why or why not admission, treatments were needed:32947:::1}   Final diagnoses:  None    ED Discharge Orders     None

## 2023-07-19 ENCOUNTER — Ambulatory Visit: Attending: Internal Medicine | Admitting: Internal Medicine

## 2023-07-19 ENCOUNTER — Ambulatory Visit (INDEPENDENT_AMBULATORY_CARE_PROVIDER_SITE_OTHER): Payer: Self-pay | Admitting: Gastroenterology

## 2023-07-19 ENCOUNTER — Encounter: Payer: Self-pay | Admitting: Internal Medicine

## 2023-07-19 VITALS — BP 122/74 | HR 85 | Ht 69.0 in | Wt 288.0 lb

## 2023-07-19 DIAGNOSIS — R030 Elevated blood-pressure reading, without diagnosis of hypertension: Secondary | ICD-10-CM | POA: Insufficient documentation

## 2023-07-19 NOTE — Progress Notes (Signed)
 Cardiology Office Note  Date: 07/19/2023   ID: Gene Berry, DOB Sep 03, 2000, MRN [id number]  PCP:  Terry Wilhelmena Lloyd Hilario, FNP  Cardiologist:  Diannah SHAUNNA Maywood, MD Electrophysiologist:  None   Reason for Office Visit: Follow-up of elevated blood pressures, post ER visit   History of Present Illness: Gene Berry is a 23 y.o. male known to have ADHD presented to the cardiology clinic for post ER visit for elevated blood pressures.    He was initially referred to cardiology clinic for evaluation of chest pain/palpitations. Cardiac workup including echocardiogram, NM stress test, event monitor and CT cardiac were unremarkable.  He recently had ER visit for not feeling well and was noted to have elevated blood pressures around 160 to 170 mmHg SBP.  He is here today for follow-up visit.  Patient reported that he was working in the hot sun couple days ago on 07/17/2023 when he started to suddenly feel sick.  He went to the ER to get checked.  He was noted to have elevated blood pressures, 160 to 170 mm Hg. He was also having severe headaches and dizziness at that time.  He continues to have daily chest pains.  Does not have any other symptoms.  He is worried about his strong family history of heart disease, his maternal and paternal side had PCI/CABG in their 35s.   He vapes, denied alcohol use or illicit drug abuse.  Past Medical History:  Diagnosis Date   ADHD    Bipolar affective disorder (HCC)    Heart murmur    SVT (supraventricular tachycardia) (HCC)     Past Surgical History:  Procedure Laterality Date   APPENDECTOMY      No current outpatient medications on file.   No current facility-administered medications for this visit.   Allergies:  Augmentin [amoxicillin-pot clavulanate] and Morphine   Social History: The patient  reports that he quit smoking about 3 years ago. His smoking use included cigarettes. He started smoking about 7 years ago. He has a 8.7  pack-year smoking history. He has never been exposed to tobacco smoke. He has quit using smokeless tobacco.  His smokeless tobacco use included chew. He reports current alcohol use of about 1.0 standard drink of alcohol per week. He reports that he does not currently use drugs after having used the following drugs: Marijuana.   Family History: The patient's family history includes Anxiety disorder in his mother; Cancer - Other in his father; Cirrhosis in his maternal grandfather; Heart attack in his maternal grandfather, maternal grandmother, paternal grandfather, and paternal grandmother; Hypertension in his father; Stroke in his maternal grandfather.   ROS:  Please see the history of present illness. Otherwise, complete review of systems is positive for none.  All other systems are reviewed and negative.   Physical Exam: VS:  BP 122/74   Pulse 85   Ht 5' 9 (1.753 m)   Wt 288 lb (130.6 kg)   SpO2 99%   BMI 42.53 kg/m , BMI Body mass index is 42.53 kg/m.  Wt Readings from Last 3 Encounters:  07/19/23 288 lb (130.6 kg)  07/17/23 288 lb (130.6 kg)  07/12/23 288 lb 9.6 oz (130.9 kg)    General: Patient appears comfortable at rest. HEENT: Conjunctiva and lids normal, oropharynx clear with moist mucosa. Neck: Supple, no elevated JVP or carotid bruits, no thyromegaly. Lungs: Clear to auscultation, nonlabored breathing at rest. Cardiac: Regular rate and rhythm, no S3 or significant systolic murmur, no pericardial rub.  Abdomen: Soft, nontender, no hepatomegaly, bowel sounds present, no guarding or rebound. Extremities: No pitting edema, distal pulses 2+. Skin: Warm and dry. Musculoskeletal: No kyphosis. Neuropsychiatric: Alert and oriented x3, affect grossly appropriate.  Recent Labwork: 12/20/2022: TSH 1.480 05/22/2023: Magnesium 2.2 07/17/2023: ALT 31; AST 22; BUN 14; Creatinine, Ser 0.86; Hemoglobin 15.3; Platelets 288; Potassium 3.6; Sodium 137     Component Value Date/Time   CHOL  221 (H) 12/20/2022 1043   TRIG 172 (H) 12/20/2022 1043   HDL 31 (L) 12/20/2022 1043   CHOLHDL 7.1 (H) 12/20/2022 1043   LDLCALC 158 (H) 12/20/2022 1043    Other Studies Reviewed Today: Echo in September 2021  1. Left ventricular ejection fraction, by estimation, is 55 to 60%. The  left ventricle has normal function. Left ventricular endocardial border  not optimally defined to evaluate regional wall motion. Left ventricular  diastolic parameters were normal.   2. Right ventricular systolic function is normal. The right ventricular  size is normal. Tricuspid regurgitation signal is inadequate for assessing  PA pressure.   3. The mitral valve is grossly normal. Trivial mitral valve  regurgitation.   4. The aortic valve is tricuspid. Aortic valve regurgitation is not  visualized.   5. The inferior vena cava is normal in size with greater than 50%  respiratory variability, suggesting right atrial pressure of 3 mmHg.   NM stress test in 2022 There was no ST segment deviation noted during stress. The study is normal. There are no perfusion defects consistent with prior infarct or current ischemia. This is a low risk study. The left ventricular ejection fraction is normal (55-65%).  Long-term monitoring 2022 11 day monitor Rare supraventricular ectopy in the form of isolated PACs No ventricular ectopy Symptoms correlated with sinus rhythm and rare PACs. No significant arrhythmias  Assessment and Plan:  Elevated blood pressure without diagnosis of HTN: Recent ER visit with elevated blood pressures, 160 to 170 mmHg SBP.  Likely in setting of heatstroke as he was working in the hot sun in the nearby factory with symptoms of headache/dizziness. Does not check blood pressures at home.  He bought a blood pressure machine yesterday and started checking.  BP today in the clinic is normal.  Check BP in a.m. and p.m., keep a log and follow-up with PCP for HTN management.  Strongly encouraged  weight loss to help with BP control (if he has HTN).  Noncardiac chest pain: Continues to have daily chest pains.  Cardiac workup including echocardiogram, NM stress test and CT cardiac has been unremarkable so far.  Follow-up with PCP for noncardiac causes of chest pain.   Medication Adjustments/Labs and Tests Ordered: Current medicines are reviewed at length with the patient today.  Concerns regarding medicines are outlined above.   Tests Ordered: No orders of the defined types were placed in this encounter.   Medication Changes: No orders of the defined types were placed in this encounter.   Disposition:  Follow up as needed  Signed Rodrickus Min Arleta Maywood, MD, 07/19/2023 10:14 AM    [facility] at [facility] [address] [place], [place], [place] [address]

## 2023-07-19 NOTE — Patient Instructions (Signed)

## 2023-07-20 ENCOUNTER — Ambulatory Visit (HOSPITAL_COMMUNITY)
Admission: RE | Admit: 2023-07-20 | Discharge: 2023-07-20 | Disposition: A | Discharge status: Home / Self Care | Source: Ambulatory Visit | Attending: Gastroenterology

## 2023-07-20 DIAGNOSIS — R197 Diarrhea, unspecified: Secondary | ICD-10-CM | POA: Diagnosis present

## 2023-07-23 ENCOUNTER — Encounter (INDEPENDENT_AMBULATORY_CARE_PROVIDER_SITE_OTHER): Payer: Self-pay

## 2023-07-23 ENCOUNTER — Other Ambulatory Visit (INDEPENDENT_AMBULATORY_CARE_PROVIDER_SITE_OTHER): Payer: Self-pay | Admitting: Gastroenterology

## 2023-07-23 MED ORDER — GOLYTELY 236 G PO SOLR: 4000.0000 mL | Freq: Once | ORAL | 0 refills | Status: AC

## 2023-07-26 LAB — FECAL FAT, QUALITATIVE: FECAL FAT, QUALITATIVE: NORMAL

## 2023-07-26 LAB — PANCREATIC ELASTASE, FECAL: Pancreatic Elastase-1, Stool: 559 ug/g (ref >200)

## 2023-09-17 ENCOUNTER — Telehealth: Admitting: Physician Assistant

## 2023-09-17 DIAGNOSIS — R040 Epistaxis: Secondary | ICD-10-CM

## 2023-09-17 NOTE — Progress Notes (Signed)
  Because of having nose bleeds that are lasting for long periods of time, I feel your condition warrants further evaluation and I recommend that you be seen in a face-to-face visit. You may need to have labs checked and to have your Blood pressure checked.   NOTE: There will be NO CHARGE for this E-Visit   If you are having a true medical emergency, please call 911.     For an urgent face to face visit, [place] has multiple urgent care centers for your convenience.  Click the link below for the full list of locations and hours, walk-in wait times, appointment scheduling options and driving directions:  Urgent Care - [place], [place], [place], [place], [place], [place]  [place]     Your MyChart E-visit questionnaire answers were reviewed by a board certified advanced clinical practitioner to complete your personal care plan based on your specific symptoms.    Thank you for using e-Visits.       I have spent 5 minutes in review of e-visit questionnaire, review and updating patient chart, medical decision making and response to patient.   Delon CHRISTELLA Dickinson, PA-C

## 2023-09-18 ENCOUNTER — Ambulatory Visit: Payer: Self-pay

## 2023-09-18 NOTE — Telephone Encounter (Signed)
 Appt made.

## 2023-09-18 NOTE — Telephone Encounter (Signed)
 FYI Only or Action Required?: Action required by provider: request for appointment.  Patient was last seen in primary care on 05/22/2023 by Gene Suzzane POUR, MD.  Called Nurse Triage reporting Nose Problem.  Symptoms began several days ago.  Interventions attempted: Rest, hydration, or home remedies.  Symptoms are: unchangedFYI Only or Action Required?: FYI only for provider.  Patient was last seen in primary care on 05/22/2023 by Gene Suzzane POUR, MD.  Called Nurse Triage reporting Nose Problem.  Symptoms began several days ago.  Interventions attempted: Rest, hydration, or home remedies.  Symptoms are: unchanged.  Triage Disposition: See Physician Within 24 Hours  Patient/caregiver understands and will follow disposition?: Yes.  Triage Disposition: See Physician Within 24 Hours  Patient/caregiver understands and will follow disposition?: YesCopied from CRM #[id number]. Topic: Clinical - Red Word Triage >> Sep 18, 2023 10:43 AM [name] wrote: Red Word that prompted transfer to Nurse Triage: Having bad nose bleeds, been going for 4 days now. Stopped bleeding 6:45 last night lots of blood. Tomorrow he is off if he can get in. As well as sinuses and allergies really bad as well. Reason for Disposition  [1] Bleeding recurs 3 or more times in 24 hours AND [2] direct pressure applied correctly  Answer Assessment - Initial Assessment Questions Nosebleeds have been on and off since Friday. Drips then blood clot appears. Once chunk of blood is removed it stops bleeding.  Pt had nose bleeds last year due to allergies/Claritin drying sinuses out. Bleeding is controlled. Pt requested appt for tomorrow due to work today.     1. AMOUNT OF BLEEDING: How bad is the bleeding? How much blood was lost? Has the bleeding stopped?     1/2 pint or more?' 2. ONSET: When did the nosebleed start?      Friday night 3. FREQUENCY: How many nosebleeds have you had in the last 24 hours?       9 4. RECURRENT SYMPTOMS: Have there been other recent nosebleeds? If Yes, ask: How long did it take you to stop the bleeding? What worked best?      Last year 5. CAUSE: What do you think caused this nosebleed?     allergies 6. LOCAL FACTORS: Do you have any cold symptoms?, Have you been rubbing or picking at your nose?     Drainage, nasal congestion 7. SYSTEMIC FACTORS: Do you have high blood pressure or any bleeding problems?     denies 8. BLOOD THINNERS: Do you take any blood thinners? (e.g., aspirin, clopidogrel / Plavix, coumadin, heparin). Notes: Other strong blood thinners include: Arixtra (fondaparinux), Eliquis (apixaban), Pradaxa (dabigatran), and Xarelto (rivaroxaban).     denies 9. OTHER SYMPTOMS: Do you have any other symptoms? (e.g., lightheadedness)     Dizzy on and off but not currenlty  Protocols used: Nosebleed-A-AH

## 2023-09-19 ENCOUNTER — Encounter: Payer: Self-pay | Admitting: Family Medicine

## 2023-09-19 ENCOUNTER — Ambulatory Visit (INDEPENDENT_AMBULATORY_CARE_PROVIDER_SITE_OTHER): Payer: Self-pay | Admitting: Family Medicine

## 2023-09-19 VITALS — BP 118/71 | HR 88 | Resp 18 | Ht 69.0 in | Wt 286.1 lb

## 2023-09-19 DIAGNOSIS — R040 Epistaxis: Secondary | ICD-10-CM | POA: Insufficient documentation

## 2023-09-19 NOTE — Progress Notes (Signed)
   Gene Berry     MRN: [id number]      DOB: June 29, 2000  Chief Complaint  Patient presents with   Epistaxis    Pt complains of nose bleeds since Friday. Has had a nose bleed everyday since then. Pt states small blood clots will come out, and takes about 30 minutes for it to stop while applying pressure. Has allergies last week and used nasal spray but has not used since earlier last week.     HPI Gene Berry reports 9 nose bleeds this past weekend, and  again this past Monday he had his  most recent episode no light headedness.but reports a significant amouunt of bleeding with each episode soaking his shirt, often unprovoked, however states if he sneezes or blows his nose he will have some bleeding Has had 3 episodes in the past 3 years also from rigth nostril but not as severe. Reports pole struck right cheek 4 days before symptom onset while he wasonthe job, he is a Engineer, maintenance ROS See HPI   PE  BP 118/71   Pulse 88   Resp 18   Ht 5' 9 (1.753 m)   Wt 286 lb 1.9 oz (129.8 kg)   SpO2 97%   BMI 42.25 kg/m   Patient alert and oriented and in no cardiopulmonary distress.  HEENT: No facial asymmetry, EOMI,     Neck supple .  Chest: Clear to auscultation bilaterally.  CVS: S1, S2 no murmurs, no S3.Regular rate.  ABD: Soft non tender.   Ext: No edema  Psych: Good eye contact, normal affect. Memory intact not anxious or depressed appearing.  CNS: CN 2-12 intact, power,  normal throughout.no focal deficits noted.   Assessment & Plan  Right-sided epistaxis Significant recurrent right epistaxis needs urgent ENT eval based on current history of severe bleeding

## 2023-09-19 NOTE — Patient Instructions (Addendum)
 F/U as before  You are referred urgently to ENT re one sided  nose bleeds  Thanks for choosing [facility], we consider it a privelige to serve you.

## 2023-09-19 NOTE — Assessment & Plan Note (Addendum)
 Significant recurrent right epistaxis needs urgent ENT eval based on current history of severe bleeding General information re nose bleeds discussed at visit and management , advised if significant bleeding occurred and could not be stopped to go to the ED . Pt ed information sent to pt after the visit

## 2023-09-25 ENCOUNTER — Telehealth (INDEPENDENT_AMBULATORY_CARE_PROVIDER_SITE_OTHER): Payer: Self-pay | Admitting: Gastroenterology

## 2023-09-25 ENCOUNTER — Ambulatory Visit (INDEPENDENT_AMBULATORY_CARE_PROVIDER_SITE_OTHER): Admitting: Gastroenterology

## 2023-09-25 ENCOUNTER — Encounter (INDEPENDENT_AMBULATORY_CARE_PROVIDER_SITE_OTHER): Payer: Self-pay | Admitting: Gastroenterology

## 2023-09-25 VITALS — BP 122/79 | HR 82 | Temp 97.8°F | Ht 69.0 in | Wt 291.8 lb

## 2023-09-25 DIAGNOSIS — K5900 Constipation, unspecified: Secondary | ICD-10-CM

## 2023-09-25 DIAGNOSIS — R197 Diarrhea, unspecified: Secondary | ICD-10-CM

## 2023-09-25 MED ORDER — LUBIPROSTONE 24 MCG PO CAPS
24.0000 ug | ORAL_CAPSULE | Freq: Two times a day (BID) | ORAL | 1 refills | Status: AC
Start: 2023-09-25 — End: ?

## 2023-09-25 NOTE — Telephone Encounter (Signed)
 Noted

## 2023-09-25 NOTE — Patient Instructions (Signed)
 Please complete bowel prep sent in at last visit After that, please start amitiza  24mcg twice daily Take this with food and water Increase water intake, aim for atleast 64 oz per day Increase fruits, veggies and whole grains, kiwi and prunes are especially good for constipation  If you are not having improvement in symptoms, please let me know  Follow up 2 months  It was a pleasure to see you today. I want to create trusting relationships with patients and provide genuine, compassionate, and quality care. I truly value your feedback! please be on the lookout for a survey regarding your visit with me today. I appreciate your input about our visit and your time in completing this!    Kiesha Ensey L. Samin Milke, MSN, APRN, AGNP-C Adult-Gerontology Nurse Practitioner [facility] at [facility]

## 2023-09-25 NOTE — Telephone Encounter (Signed)
 Pt contacted. Pt states he is half way through jug  Susan-can you send this pt a work note for tomorrow through his my chart? Thank you!

## 2023-09-25 NOTE — Progress Notes (Addendum)
 Referring Provider: Del Orbe Polanco, Ilian* Primary Care Physician:  Terry Wilhelmena Lloyd Hilario, FNP Primary GI Physician: Dr. Eartha   Chief Complaint  Patient presents with   Follow-up    Pt arrives for follow up. Pt states symptoms have not improved. Miralax did not work for pt.    HPI:   Gene Berry is a 23 y.o. male with past medical history of generalized anxiety disorder, reflux, SVT, ADHD and heart murmur   Patient presenting today for:  Overflow Diarrhea in setting of constipation  Last seen June, at that time reporting watery diarrhea 3-4 episodes per day for the past 4-5 months. Some fecal urgency, tried metamucil and imodium without improvement. Some mild weight loss. Previously having one good BM per week.   Recommend fecal fat, pancreatic elastase, celiac panel, abd xray, schedule colonoscopy if the above testing is negative  Abd xray 07/20/23: Moderate volume fecal loading throughout the right colon and rectosigmoid colon, as can be seen in constipation  Fecal fat normal Pancreatic elastase 559 Celiac panel negative  GI stool panel negative  CMP, CBC, magnesium all WNL TSh 1.48, T4 1.29 in November 2024  Present:  States he did not get the bowel prep, but he did take a bottle of miralax over the course of three days but did not feel that anything really changed. He notes if he eats something like [employer] he will have more diarrhea. On average have about 3-4 BMs per day, smaller volume usually. Only has some abdominal discomfort when he has to use the restroom. Denies any formed stools, he has either liquid stools or very soft/ mushy stools. No rectal bleeding or melena. No changes in appetite or weight loss, has actually gained some weight, per patient. Denies nausea or vomiting.    CT A/P with contrast: 09/2022 scattered diverticula Last Colonoscopy: never  Last Endoscopy: never    Past Medical History:  Diagnosis Date   ADHD    Bipolar affective  disorder (HCC)    Heart murmur    SVT (supraventricular tachycardia) (HCC)     Past Surgical History:  Procedure Laterality Date   APPENDECTOMY      Current Outpatient Medications  Medication Sig Dispense Refill   lubiprostone  (AMITIZA ) 24 MCG capsule Take 1 capsule (24 mcg total) by mouth 2 (two) times daily with a meal. 60 capsule 1   No current facility-administered medications for this visit.    Allergies as of 09/25/2023 - Review Complete 09/25/2023  Allergen Reaction Noted   Augmentin [amoxicillin-pot clavulanate] Anaphylaxis 10/23/2018   Morphine Anaphylaxis 04/30/2022    Social History   Socioeconomic History   Marital status: Significant Other    Spouse name: Robet Gaskins   Number of children: Not on file   Years of education: Not on file   Highest education level: GED or equivalent  Occupational History   Occupation: Chief Financial Officer    Comment: Web designer  Tobacco Use   Smoking status: Former    Current packs/day: 0.50    Average packs/day: 1.6 packs/day for 5.7 years (8.8 ttl pk-yrs)    Types: Cigarettes    Start date: 10/27/2015    Quit date: 10/27/2019    Passive exposure: Never   Smokeless tobacco: Former    Types: Chew   Tobacco comments:    smokes 5-6 cigarettes per day, vapes all other times  Vaping Use   Vaping status: Every Day   Substances: THC, Flavoring, Nicotine-salt  Substance and Sexual Activity  Alcohol use: Yes    Alcohol/week: 1.0 standard drink of alcohol    Types: 1 Standard drinks or equivalent per week    Comment: occ   Drug use: Not Currently    Types: Marijuana    Comment: cbd gummies   Sexual activity: Yes    Birth control/protection: None  Other Topics Concern   Not on file  Social History Narrative   Not on file   Social Drivers of Health   Financial Resource Strain: Not on file  Food Insecurity: Not on file  Transportation Needs: Not on file  Physical Activity: Not on file  Stress: Not on file  Social  Connections: Not on file    Review of systems General: negative for malaise, night sweats, fever, chills, weight loss Neck: Negative for lumps, goiter, pain and significant neck swelling Resp: Negative for cough, wheezing, dyspnea at rest CV: Negative for chest pain, leg swelling, palpitations, orthopnea GI: denies melena, hematochezia, nausea, vomiting, constipation, dysphagia, odyonophagia, early satiety or unintentional weight loss. +diarrhea  MSK: Negative for joint pain or swelling, back pain, and muscle pain. Derm: Negative for itching or rash Psych: Denies depression, anxiety, memory loss, confusion. No homicidal or suicidal ideation.  Heme: Negative for prolonged bleeding, bruising easily, and swollen nodes. Endocrine: Negative for cold or heat intolerance, polyuria, polydipsia and goiter. Neuro: negative for tremor, gait imbalance, syncope and seizures. The remainder of the review of systems is noncontributory.  Physical Exam: BP 122/79   Pulse 82   Temp 97.8 F (36.6 C)   Ht 5' 9 (1.753 m)   Wt 291 lb 12.8 oz (132.4 kg)   BMI 43.09 kg/m  General:   Alert and oriented. No distress noted. Pleasant and cooperative.  Head:  Normocephalic and atraumatic. Eyes:  Conjuctiva clear without scleral icterus. Mouth:  Oral mucosa pink and moist. Good dentition. No lesions. Heart: Normal rate and rhythm, s1 and s2 heart sounds present.  Lungs: Clear lung sounds in all lobes. Respirations equal and unlabored. Abdomen:  +BS, soft, non-tender and non-distended. No rebound or guarding. No HSM or masses noted. Derm: No palmar erythema or jaundice Msk:  Symmetrical without gross deformities. Normal posture. Extremities:  Without edema. Neurologic:  Alert and  oriented x4 Psych:  Alert and cooperative. Normal mood and affect.  Invalid input(s): 6 MONTHS   ASSESSMENT: Gene Berry is a 23 y.o. male presenting today for follow up of diarrhea  Diarrhea: thought to be overflow in  setting of constipation, as evidenced on abdominal xray in June. Stool testing negative, thyroid  function and CMP WNL. No rectal bleeding, weight loss, melena, nausea or vomiting. Did not complete bowel prep sent in after xray in June but did some miralax without improvement. Not currently taking anything for constipation though does have history of constipation with previously only 1 BM per week. At this time, presentation is still suspicious for overflow diarrhea in setting of constipation, recommend completing bowel prep and starting amitiza  24mcg BID thereafter. given no alarm symptoms, will hold off on endoscopic evaluation, though If symptoms fail to improve, will need to pursue colonoscopy for further evaluation, which I discussed with the patient though at this time,    PLAN:  -complete bowel prep -Increase water intake, aim for atleast 64 oz per day -Increase fruits, veggies and whole grains, kiwi and prunes are especially good for constipation -start amitiza  24mcg BID (take with food) after bowel prep -schedule colonoscopy if not improving with the above interventions  All questions  were answered, patient verbalized understanding and is in agreement with plan as outlined above.   Follow Up: 2 months   Cleota Pellerito L. Mariette, MSN, APRN, AGNP-C Adult-Gerontology Nurse Practitioner [facility]  I have reviewed the note and agree with the APP's assessment as described in this progress note  Toribio Fortune, MD Gastroenterology and Hepatology [facility]enterology

## 2023-09-25 NOTE — Telephone Encounter (Addendum)
 Pt left voicemail stating that he picked up his meds from [facility] and they gave him a big jug with powder at the bottom and they advised him to call us  to see how much he needs to take over the next 24 hours and if he had to take it today can he have a doctors note for work tomorrow. Please advise. Thank you!

## 2023-09-26 ENCOUNTER — Institutional Professional Consult (permissible substitution) (INDEPENDENT_AMBULATORY_CARE_PROVIDER_SITE_OTHER)

## 2023-10-02 ENCOUNTER — Institutional Professional Consult (permissible substitution) (INDEPENDENT_AMBULATORY_CARE_PROVIDER_SITE_OTHER)

## 2023-10-11 ENCOUNTER — Ambulatory Visit: Payer: Self-pay

## 2023-10-11 NOTE — Telephone Encounter (Signed)
 Appt made.

## 2023-10-11 NOTE — Telephone Encounter (Signed)
 FYI Only or Action Required?: FYI only for provider.  Patient was last seen in primary care on 09/19/2023 by Antonetta Rollene BRAVO, MD.  Called Nurse Triage reporting Dental Pain.  Symptoms began x 3 months and worsening.  Interventions attempted: Other: attempted to schedule dental appt: pt is waiting for them to call him back.  Symptoms are: gradually worsening.  Triage Disposition: See Dentist Within 24 Hours  Patient/caregiver understands and will follow disposition?: Yes    Copied from CRM #[id number]. Topic: Clinical - Red Word Triage >> Oct 11, 2023 10:47 AM Debby BROCKS wrote: Red Word that prompted transfer to Nurse Triage: Patient has Tooth infection and wants  to know if he can get antibiotics or something else prescribed Reason for Disposition  [1] Face swelling AND [2] no fever  Answer Assessment - Initial Assessment Questions 1. LOCATION: Which tooth is hurting?  (e.g., right-side/left-side, upper/lower, front/back)     3 teeth in the top left of the mouth - teeth have holes in them 2. ONSET: When did the toothache start?  (e.g., hours, days)      X3 months and worsening  3. SEVERITY: How bad is the toothache?  (Scale 1-10; mild, moderate or severe)     severe 4. SWELLING: Is there any visible swelling of your face?     no 5. OTHER SYMPTOMS: Do you have any other symptoms? (e.g., fever)     no 6. PREGNANCY: Is there any chance you are pregnant? When was your last menstrual period?     Na  Pt used a 1/2 tube of Orajel and it did not help  Protocols used: Toothache-A-AH

## 2023-10-12 ENCOUNTER — Telehealth (INDEPENDENT_AMBULATORY_CARE_PROVIDER_SITE_OTHER): Payer: Self-pay | Admitting: Family Medicine

## 2023-10-12 ENCOUNTER — Encounter: Payer: Self-pay | Admitting: Family Medicine

## 2023-10-12 DIAGNOSIS — K047 Periapical abscess without sinus: Secondary | ICD-10-CM

## 2023-10-12 DIAGNOSIS — K0889 Other specified disorders of teeth and supporting structures: Secondary | ICD-10-CM

## 2023-10-12 MED ORDER — CLINDAMYCIN HCL 300 MG PO CAPS: 300.0000 mg | ORAL_CAPSULE | Freq: Four times a day (QID) | ORAL | 0 refills | Status: AC

## 2023-10-12 NOTE — Progress Notes (Signed)
 Virtual Visit via Video   I connected with patient on 10/12/23 at 1215 by a video enabled telemedicine application and verified that I am speaking with the correct person using two identifiers.  Location patient: Home Location provider: [facility] Persons participating in the virtual visit: Patient and Provider  I discussed the limitations of evaluation and management by telemedicine and the availability of in person appointments. The patient expressed understanding and agreed to proceed.  Subjective:   HPI:  Pt presents today for  Chief Complaint  Patient presents with   Dental Pain   Gene Berry is a 23 year old male who presents with a suspected dental infection.  He reports pain in the upper middle teeth for approximately two months. Initially, the discomfort was manageable with over-the-counter Orgel, but recently the pain has intensified, necessitating the use of an ice pack for relief. He describes the presence of holes in the affected teeth and notes that his face swelled up the previous day, although the swelling has since subsided.  He has not seen a dentist in years and does not have a regular dental care provider. He has an allergy to Augmentin, which caused throat swelling and chest redness during childhood. He is unsure about his reaction to plain penicillin, as it has been a long time since he last took antibiotics.  He considered going to the emergency room due to the severity of the pain but was unable to do so because of work commitments. No gum redness or swelling, but facial swelling that has since resolved.       ROS per HPI  Patient Active Problem List   Diagnosis Date Noted   Constipation 09/25/2023   Right-sided epistaxis 09/19/2023   Elevated blood pressure reading without diagnosis of hypertension 07/19/2023   Loss of weight 07/12/2023   Overflow diarrhea 05/22/2023   Gastroesophageal reflux disease 05/22/2023    Dehydration symptoms 05/22/2023   Hx of bipolar disorder 12/13/2022   Encounter for routine adult physical exam with abnormal findings 12/13/2022   Generalized anxiety disorder 11/23/2021   Non-cardiac chest pain 11/21/2021    Social History   Tobacco Use   Smoking status: Former    Current packs/day: 0.50    Average packs/day: 1.5 packs/day for 5.7 years (8.9 ttl pk-yrs)    Types: Cigarettes    Start date: 10/27/2015    Quit date: 10/27/2019    Passive exposure: Never   Smokeless tobacco: Former    Types: Chew   Tobacco comments:    smokes 5-6 cigarettes per day, vapes all other times  Substance Use Topics   Alcohol use: Yes    Alcohol/week: 1.0 standard drink of alcohol    Types: 1 Standard drinks or equivalent per week    Comment: occ    Current Outpatient Medications:    clindamycin  (CLEOCIN ) 300 MG capsule, Take 1 capsule (300 mg total) by mouth 4 (four) times daily for 7 days., Disp: 28 capsule, Rfl: 0   lubiprostone  (AMITIZA ) 24 MCG capsule, Take 1 capsule (24 mcg total) by mouth 2 (two) times daily with a meal., Disp: 60 capsule, Rfl: 1  Allergies  Allergen Reactions   Augmentin [Amoxicillin-Pot Clavulanate] Anaphylaxis   Morphine Anaphylaxis    Objective:   There were no vitals taken for this visit.  Patient is well-developed, well-nourished in no acute distress.  Resting comfortably at home.  Head is normocephalic, atraumatic.  No labored breathing.  Speech is clear and coherent with  logical content.  Patient is alert and oriented at baseline.  Dental caries and gingival swelling with erythema.   Assessment and Plan:   Todd was seen today for dental pain.  Diagnoses and all orders for this visit:  Pain, dental -     clindamycin  (CLEOCIN ) 300 MG capsule; Take 1 capsule (300 mg total) by mouth 4 (four) times daily for 7 days.  Dental abscess -     clindamycin  (CLEOCIN ) 300 MG capsule; Take 1 capsule (300 mg total) by mouth 4 (four) times daily for 7  days.      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 No recent dental care. Suspected penicillin allergy due to previous allergic reaction to Augmentin (throat swelling and chest redness). - Prescribe clindamycin  for dental abscess due to potential penicillin allergy, with discussion of Clostridioides difficile infection risk with prolonged use. - Recommend NSAIDs for pain management: Motrin  (three pills every eight hours with food) or Aleve  (two pills twice a day with food). - Refer to dentist for definitive treatment of cavities and abscess. - Advise antiseptic mouthwash for oral hygiene and pain relief. - Send prescription to [facility] pharmacy in [place].        Return if symptoms worsen or fail to improve.  [name], FNP-C [facility] [address] [place], [place] [address] [phone]  10/12/2023  Time spent with the patient: 13 minutes, of which >50% was spent in obtaining information about symptoms, reviewing previous labs, evaluations, and treatments, counseling about condition (please see the discussed topics above), and developing a plan to further investigate it; had a number of questions which I addressed.

## 2023-10-25 ENCOUNTER — Institutional Professional Consult (permissible substitution) (INDEPENDENT_AMBULATORY_CARE_PROVIDER_SITE_OTHER)

## 2023-12-14 ENCOUNTER — Ambulatory Visit: Payer: Medicaid Other | Admitting: Family Medicine

## 2023-12-17 ENCOUNTER — Ambulatory Visit (INDEPENDENT_AMBULATORY_CARE_PROVIDER_SITE_OTHER): Admitting: Gastroenterology

## 2023-12-17 ENCOUNTER — Encounter (INDEPENDENT_AMBULATORY_CARE_PROVIDER_SITE_OTHER): Payer: Self-pay | Admitting: Gastroenterology

## 2023-12-31 ENCOUNTER — Ambulatory Visit

## 2024-02-07 ENCOUNTER — Telehealth: Admitting: Physician Assistant

## 2024-02-07 DIAGNOSIS — R051 Acute cough: Secondary | ICD-10-CM | POA: Diagnosis not present

## 2024-02-07 DIAGNOSIS — R52 Pain, unspecified: Secondary | ICD-10-CM

## 2024-02-07 DIAGNOSIS — J029 Acute pharyngitis, unspecified: Secondary | ICD-10-CM

## 2024-02-07 DIAGNOSIS — R6889 Other general symptoms and signs: Secondary | ICD-10-CM

## 2024-02-07 MED ORDER — NAPROXEN 500 MG PO TABS: 500.0000 mg | ORAL_TABLET | Freq: Two times a day (BID) | ORAL | 0 refills | Status: AC

## 2024-02-07 MED ORDER — FLUTICASONE PROPIONATE 50 MCG/ACT NA SUSP: 2.0000 | Freq: Every day | NASAL | 0 refills | Status: AC

## 2024-02-07 MED ORDER — BENZONATATE 100 MG PO CAPS: 100.0000 mg | ORAL_CAPSULE | Freq: Three times a day (TID) | ORAL | 0 refills | Status: AC | PRN

## 2024-02-07 NOTE — Progress Notes (Signed)
 E visit for Flu like symptoms   We are sorry that you are not feeling well.  Here is how we plan to help! Based on what you have shared with me it looks like you may have flu-like symptoms that should be watched but do not seem to indicate anti-viral treatment.  Influenza or the flu is  an infection caused by a respiratory virus. The flu virus is highly contagious and persons who did not receive their yearly flu vaccination may catch the flu from close contact.  We have anti-viral medications to treat the viruses that cause this infection. They are not a cure and only shorten the course of the infection. These prescriptions are most effective when they are given within the first 2 days of flu symptoms.   For nasal congestion, you may use an oral decongestant such as Mucinex D or if you have glaucoma or high blood pressure use plain Mucinex.  Saline nasal spray or nasal drops can help and can safely be used as often as needed for congestion.  If you have a sore or scratchy throat, use a saltwater gargle-  to  teaspoon of salt dissolved in a 4-ounce to 8-ounce glass of warm water.  Gargle the solution for approximately 15-30 seconds and then spit.  It is important not to swallow the solution.  You can also use throat lozenges/cough drops and Chloraseptic spray to help with throat pain or discomfort.  Warm or cold liquids can also be helpful in relieving throat pain.  For headache, pain or general discomfort, you can use Ibuprofen  or Tylenol  as directed.   Some authorities believe that zinc sprays or the use of Echinacea may shorten the course of your symptoms.  I have prescribed the following medications to help lessen symptoms: I have prescribed Tessalon Perles 100 mg. You may take 1-2 capsules every 8 hours as needed for cough, I have prescribed an anti-inflammatory - Naprosyn  500 mg. Take twice daily as needed for fever or body aches for 2 weeks, and I have prescribed Fluticasone  nasal  spray 2 sprays in each nostril one time per dayasal spray 2 sprays in each nostril one time per day  You are to isolate at home until you have been fever-free for at least 24 hours without a fever-reducing medication, and symptoms have been steadily improving for 24 hours.  If you must be around other household members who do not have symptoms, you need to make sure that both you and the family members are masking consistently with a high-quality mask.  If you note any worsening of symptoms despite treatment, please seek an in-person evaluation ASAP. If you note any significant shortness of breath or any chest pain, please seek ED evaluation. Please do not delay care!  ANYONE WHO HAS FLU SYMPTOMS SHOULD: Stay home. The flu is highly contagious and going out or to work exposes others! Be sure to drink plenty of fluids. Water is fine as well as fruit juices, sodas and electrolyte beverages. You may want to stay away from caffeine or alcohol. If you are nauseated, try taking small sips of liquids. How do you know if you are getting enough fluid? Your urine should be a pale yellow or almost colorless. Get rest. Taking a steamy shower or using a humidifier may help nasal congestion and ease sore throat pain. Using a saline nasal spray works much the same way. Cough drops, hard candies and sore throat lozenges may ease your cough. Line up a caregiver.  Have someone check on you regularly.  GET HELP RIGHT AWAY IF: You cannot keep down liquids or your medications. You become short of breath Your fell like you are going to pass out or loose consciousness. Your symptoms persist after you have completed your treatment plan  MAKE SURE YOU  Understand these instructions. Will watch your condition. Will get help right away if you are not doing well or get worse.  Your e-visit answers were reviewed by a board certified advanced clinical practitioner to complete your personal care plan.  Depending on the  condition, your plan could have included both over the counter or prescription medications.  If there is a problem please reply  once you have received a response from your provider.  Your safety is important to us .  If you have drug allergies check your prescription carefully.    You can use MyChart to ask questions about todays visit, request a non-urgent call back, or ask for a work or school excuse for 24 hours related to this e-Visit. If it has been greater than 24 hours you will need to follow up with your provider, or enter a new e-Visit to address those concerns.  You will get an e-mail in the next two days asking about your experience.  I hope that your e-visit has been valuable and will speed your recovery. Thank you for using e-visits.   I have spent 5 minutes in review of e-visit questionnaire, review and updating patient chart, medical decision making and response to patient.   Delon CHRISTELLA Dickinson, PA-C
